# Patient Record
Sex: Male | Born: 1950 | Race: White | Hispanic: No | Marital: Married | Smoking: Never smoker
Health system: Southern US, Community
[De-identification: ages and names within clinical notes are randomized; demographics above are authoritative.]

## PROBLEM LIST (undated history)

## (undated) DIAGNOSIS — I10 Essential (primary) hypertension: Secondary | ICD-10-CM

## (undated) DIAGNOSIS — F419 Anxiety disorder, unspecified: Secondary | ICD-10-CM

## (undated) HISTORY — PX: NASAL SINUS SURGERY: SHX719

---

## 2002-07-01 ENCOUNTER — Ambulatory Visit (HOSPITAL_BASED_OUTPATIENT_CLINIC_OR_DEPARTMENT_OTHER): Admission: RE | Admit: 2002-07-01 | Discharge: 2002-07-01 | Payer: Self-pay | Admitting: Family Medicine

## 2010-02-22 ENCOUNTER — Emergency Department (HOSPITAL_COMMUNITY): Admission: EM | Admit: 2010-02-22 | Discharge: 2010-02-23 | Payer: Self-pay | Admitting: Emergency Medicine

## 2014-01-20 ENCOUNTER — Encounter (HOSPITAL_COMMUNITY): Payer: Self-pay | Admitting: Emergency Medicine

## 2014-01-20 ENCOUNTER — Emergency Department (HOSPITAL_COMMUNITY): Payer: BC Managed Care – PPO

## 2014-01-20 ENCOUNTER — Inpatient Hospital Stay (HOSPITAL_COMMUNITY)
Admission: EM | Admit: 2014-01-20 | Discharge: 2014-01-23 | DRG: 419 | Disposition: A | Discharge status: Home / Self Care | Payer: BC Managed Care – PPO | Attending: Surgery | Admitting: Surgery

## 2014-01-20 DIAGNOSIS — F411 Generalized anxiety disorder: Secondary | ICD-10-CM | POA: Diagnosis present

## 2014-01-20 DIAGNOSIS — R7989 Other specified abnormal findings of blood chemistry: Secondary | ICD-10-CM | POA: Diagnosis present

## 2014-01-20 DIAGNOSIS — R972 Elevated prostate specific antigen [PSA]: Secondary | ICD-10-CM | POA: Diagnosis present

## 2014-01-20 DIAGNOSIS — R112 Nausea with vomiting, unspecified: Secondary | ICD-10-CM

## 2014-01-20 DIAGNOSIS — Z801 Family history of malignant neoplasm of trachea, bronchus and lung: Secondary | ICD-10-CM

## 2014-01-20 DIAGNOSIS — I10 Essential (primary) hypertension: Secondary | ICD-10-CM | POA: Diagnosis present

## 2014-01-20 DIAGNOSIS — K812 Acute cholecystitis with chronic cholecystitis: Secondary | ICD-10-CM

## 2014-01-20 DIAGNOSIS — K810 Acute cholecystitis: Secondary | ICD-10-CM

## 2014-01-20 DIAGNOSIS — K8066 Calculus of gallbladder and bile duct with acute and chronic cholecystitis without obstruction: Principal | ICD-10-CM | POA: Diagnosis present

## 2014-01-20 HISTORY — DX: Anxiety disorder, unspecified: F41.9

## 2014-01-20 HISTORY — DX: Essential (primary) hypertension: I10

## 2014-01-20 LAB — CBC WITH DIFFERENTIAL/PLATELET
BASOS ABS: 0 10*3/uL (ref 0.0–0.1)
Basophils Relative: 0 % (ref 0–1)
EOS ABS: 0.2 10*3/uL (ref 0.0–0.7)
EOS PCT: 2 % (ref 0–5)
HEMATOCRIT: 37.1 % — AB (ref 39.0–52.0)
Hemoglobin: 13 g/dL (ref 13.0–17.0)
LYMPHS ABS: 1 10*3/uL (ref 0.7–4.0)
LYMPHS PCT: 11 % — AB (ref 12–46)
MCH: 29.8 pg (ref 26.0–34.0)
MCHC: 35 g/dL (ref 30.0–36.0)
MCV: 85.1 fL (ref 78.0–100.0)
MONO ABS: 1 10*3/uL (ref 0.1–1.0)
Monocytes Relative: 11 % (ref 3–12)
Neutro Abs: 6.9 10*3/uL (ref 1.7–7.7)
Neutrophils Relative %: 76 % (ref 43–77)
Platelets: 274 10*3/uL (ref 150–400)
RBC: 4.36 MIL/uL (ref 4.22–5.81)
RDW: 13.2 % (ref 11.5–15.5)
WBC: 9.1 10*3/uL (ref 4.0–10.5)

## 2014-01-20 LAB — COMPREHENSIVE METABOLIC PANEL
ALT: 119 U/L — ABNORMAL HIGH (ref 0–53)
AST: 115 U/L — ABNORMAL HIGH (ref 0–37)
Albumin: 3.3 g/dL — ABNORMAL LOW (ref 3.5–5.2)
Alkaline Phosphatase: 222 U/L — ABNORMAL HIGH (ref 39–117)
BILIRUBIN TOTAL: 1.8 mg/dL — AB (ref 0.3–1.2)
BUN: 21 mg/dL (ref 6–23)
CALCIUM: 10.4 mg/dL (ref 8.4–10.5)
CO2: 22 mEq/L (ref 19–32)
Chloride: 99 mEq/L (ref 96–112)
Creatinine, Ser: 1.26 mg/dL (ref 0.50–1.35)
GFR calc non Af Amer: 59 mL/min — ABNORMAL LOW (ref >90)
GFR, EST AFRICAN AMERICAN: 68 mL/min — AB (ref >90)
Glucose, Bld: 163 mg/dL — ABNORMAL HIGH (ref 70–99)
Potassium: 3.3 mEq/L — ABNORMAL LOW (ref 3.7–5.3)
SODIUM: 139 meq/L (ref 137–147)
TOTAL PROTEIN: 6.8 g/dL (ref 6.0–8.3)

## 2014-01-20 LAB — URINALYSIS, ROUTINE W REFLEX MICROSCOPIC
BILIRUBIN URINE: NEGATIVE
GLUCOSE, UA: NEGATIVE mg/dL
HGB URINE DIPSTICK: NEGATIVE
Ketones, ur: NEGATIVE mg/dL
Leukocytes, UA: NEGATIVE
Nitrite: NEGATIVE
PH: 8 (ref 5.0–8.0)
Protein, ur: NEGATIVE mg/dL
Specific Gravity, Urine: 1.016 (ref 1.005–1.030)
Urobilinogen, UA: 1 mg/dL (ref 0.0–1.0)

## 2014-01-20 LAB — TROPONIN I

## 2014-01-20 LAB — LIPASE, BLOOD: LIPASE: 29 U/L (ref 11–59)

## 2014-01-20 MED ORDER — MORPHINE SULFATE 4 MG/ML IJ SOLN
4.0000 mg | Freq: Once | INTRAMUSCULAR | Status: AC
  Administered 2014-01-20: 4 mg via INTRAVENOUS
  Filled 2014-01-20: qty 1

## 2014-01-20 MED ORDER — SODIUM CHLORIDE 0.9 % IV BOLUS (SEPSIS)
1000.0000 mL | Freq: Once | INTRAVENOUS | Status: AC
  Administered 2014-01-20: 1000 mL via INTRAVENOUS

## 2014-01-20 MED ORDER — ONDANSETRON HCL 4 MG/2ML IJ SOLN
4.0000 mg | Freq: Once | INTRAMUSCULAR | Status: AC
  Administered 2014-01-20: 4 mg via INTRAVENOUS
  Filled 2014-01-20: qty 2

## 2014-01-20 NOTE — ED Notes (Signed)
Bed: GM01 Expected date: 01/20/14 Expected time: 10:09 PM Means of arrival: Ambulance Comments: Abdominal pain

## 2014-01-20 NOTE — ED Notes (Addendum)
VS noted Patient states that heart rate is WNL for him  Patient states "I'm athletic" Patient appears in NAD

## 2014-01-20 NOTE — ED Notes (Signed)
US at bedside

## 2014-01-20 NOTE — ED Provider Notes (Signed)
CSN: [id number]     Arrival date & time 01/20/14  2225 History   First MD Initiated Contact with Patient 01/20/14 2241     Chief Complaint  Patient presents with  . Abdominal Pain   (Consider location/radiation/quality/duration/timing/severity/associated sxs/prior Treatment) Patient is a 63 y.o. male presenting with abdominal pain. The history is provided by the patient and medical records.  Abdominal Pain Associated symptoms: nausea and vomiting    This is a 63 year old male with past medical history significant for hypertension and depression, presenting to the ED for abdominal pain, nausea, and vomiting. Patient states that for the past 10 days he has had intermittent episodes of sharp, stabbing pain to his right upper quadrant with associated non-bloody, non-bilious emesis.  He states sx do not appear to be related to food intake. Bowel movements normal, no diarrhea. This is freely passing flatus.  No prior abdominal surgeries.  Last PO intake around noon today.  No fevers or chills. Pt is currently on cipro for elevated PSA-- states he does have some increased urinary frequency and dysuria recently.  History reviewed. No pertinent past medical history. History reviewed. No pertinent past surgical history. History reviewed. No pertinent family history. History  Substance Use Topics  . Smoking status: Not on file  . Smokeless tobacco: Not on file  . Alcohol Use: Not on file    Review of Systems  Gastrointestinal: Positive for nausea, vomiting and abdominal pain.  All other systems reviewed and are negative.   Allergies  Other and Shellfish allergy  Home Medications   Current Outpatient Rx  Name  Route  Sig  Dispense  Refill  . ciprofloxacin (CIPRO) 500 MG tablet   Oral   Take 500 mg by mouth 2 (two) times daily.         . DULoxetine (CYMBALTA) 30 MG capsule   Oral   Take 30 mg by mouth daily.         . famotidine (PEPCID AC) 10 MG chewable tablet   Oral   Chew 10  mg by mouth once.         . valsartan-hydrochlorothiazide (DIOVAN-HCT) 320-12.5 MG per tablet   Oral   Take 1 tablet by mouth every morning.          BP 92/52  Pulse 42  Temp(Src) 97.8 F (36.6 C)  SpO2 100%  Physical Exam  Nursing note and vitals reviewed. Constitutional: He is oriented to person, place, and time. He appears well-developed and well-nourished. No distress.  HENT:  Head: Normocephalic and atraumatic.  Mouth/Throat: Oropharynx is clear and moist. Mucous membranes are dry.  Mucous membranes dry  Eyes: Conjunctivae and EOM are normal. Pupils are equal, round, and reactive to light.  Neck: Normal range of motion. Neck supple.  Cardiovascular: Normal rate, regular rhythm and normal heart sounds.   Pulmonary/Chest: Effort normal and breath sounds normal. No respiratory distress. He has no wheezes.  Abdominal: Soft. Bowel sounds are normal. There is tenderness in the right upper quadrant and epigastric area. There is positive Murphy's sign. There is no rigidity, no guarding and no CVA tenderness.  Abdomen soft, non-distended, TTP RUQ and epigastric regions with positive Murphy's sign  Musculoskeletal: Normal range of motion. He exhibits no edema.  Neurological: He is alert and oriented to person, place, and time.  Skin: Skin is warm and dry. He is not diaphoretic.  Psychiatric: He has a normal mood and affect.    ED Course  Procedures (including critical care time)  Labs Review Labs Reviewed  CBC WITH DIFFERENTIAL - Abnormal; Notable for the following:    HCT 37.1 (*)    Lymphocytes Relative 11 (*)    All other components within normal limits  COMPREHENSIVE METABOLIC PANEL - Abnormal; Notable for the following:    Potassium 3.3 (*)    Glucose, Bld 163 (*)    Albumin 3.3 (*)    AST 115 (*)    ALT 119 (*)    Alkaline Phosphatase 222 (*)    Total Bilirubin 1.8 (*)    GFR calc non Af Amer 59 (*)    GFR calc Af Amer 68 (*)    All other components within normal  limits  URINALYSIS, ROUTINE W REFLEX MICROSCOPIC - Abnormal; Notable for the following:    APPearance CLOUDY (*)    All other components within normal limits  LIPASE, BLOOD  TROPONIN I   Imaging Review US Abdomen Complete  01/21/2014   CLINICAL DATA:  Right upper quadrant pain, vomiting.  Elevated LFTs.  EXAM: ULTRASOUND ABDOMEN COMPLETE  COMPARISON:  CT UROGRAM dated 01/01/2013  FINDINGS: Gallbladder:  Multiple gallstones and sludge noted within the gallbladder. Gallbladder wall thickening, measuring up to 7 mm. The patient was significantly tender over the gallbladder during the study. Small amount of pericholecystic fluid. There are echogenic foci within the thickened anterior gallbladder wall. While this could reflect adherent cholesterol crystals, I cannot completely exclude gas within the gallbladder wall anteriorly.  Common bile duct:  Diameter: Borderline in caliber at 7 mm.  No visible ductal stones.  Liver:  Hyperechoic area in the right hepatic lobe of measuring up to 8 mm compatible with small hemangioma.  IVC:  No abnormality visualized.  Pancreas:  Not visualized due to overlying bowel gas.  Spleen:  Size and appearance within normal limits.  Right Kidney:  Length: 10.3 cm. Echogenicity within normal limits. No mass or hydronephrosis visualized.  Left Kidney:  Length: 11.6 cm. Echogenicity within normal limits. No mass or hydronephrosis visualized.  Abdominal aorta:  No aneurysm visualized.  Other findings:  None.  IMPRESSION: Numerous small gallstones within the gallbladder. Associated gallbladder wall thickening, pericholecystic fluid and positive sonographic Murphy sign compatible with acute cholecystitis. Echogenic foci within the anterior thickened gallbladder wall. Cannot completely exclude gallbladder wall gas.  These results were called by telephone at the time of interpretation on 01/21/2014 at 1:01 AM to [facility] , who verbally acknowledged these results.   Electronically Signed    By: Rolm Baptise M.D.   On: 01/21/2014 01:05    EKG Interpretation   None       MDM   Final diagnoses:  Acute cholecystitis  Nausea & vomiting   Pt with intermittent abdominal pain w/ nausea and vomiting over the past 10 days.  No fevers or chills.  On exam RUQ TTP with + Murphy's sign.  Will obtain labs and u/s.  Labs revealing elevated liver enzymes, alk phos, and bili.  U/s with acute cholecystitis, question of gallbladder wall gas vs cholesterol deposits.  Pt remains afebrile, BP's soft but pt is asx.  Pain and nausea well controlled.  Discussed case with general surgery, advised to give IV abx and they will evaluate in the am.  6:06 AM Still awaiting general surgery evaluation.  Pt remains stable, pain well controlled.  Larene Pickett, PA-C 01/21/14 [phone]

## 2014-01-20 NOTE — ED Notes (Signed)
Patient arrives via EMS for c/o abdominal pain x 10 days +N/V, no diarrhea per patient Patient given 4 mg Zofran IV by EMS en route to ED 20 Right AC placed by EMS

## 2014-01-21 ENCOUNTER — Observation Stay (HOSPITAL_COMMUNITY): Payer: BC Managed Care – PPO | Admitting: Certified Registered Nurse Anesthetist

## 2014-01-21 ENCOUNTER — Observation Stay (HOSPITAL_COMMUNITY): Payer: BC Managed Care – PPO

## 2014-01-21 ENCOUNTER — Encounter (HOSPITAL_COMMUNITY): Payer: BC Managed Care – PPO | Admitting: Certified Registered Nurse Anesthetist

## 2014-01-21 ENCOUNTER — Encounter (HOSPITAL_COMMUNITY): Payer: Self-pay | Admitting: Certified Registered Nurse Anesthetist

## 2014-01-21 ENCOUNTER — Encounter (HOSPITAL_COMMUNITY): Admission: EM | Disposition: A | Discharge status: Home / Self Care | Payer: Self-pay | Source: Home / Self Care

## 2014-01-21 DIAGNOSIS — R1011 Right upper quadrant pain: Secondary | ICD-10-CM

## 2014-01-21 DIAGNOSIS — K8020 Calculus of gallbladder without cholecystitis without obstruction: Secondary | ICD-10-CM

## 2014-01-21 DIAGNOSIS — K812 Acute cholecystitis with chronic cholecystitis: Secondary | ICD-10-CM

## 2014-01-21 HISTORY — PX: CHOLECYSTECTOMY: SHX55

## 2014-01-21 LAB — CREATININE, SERUM
CREATININE: 1.17 mg/dL (ref 0.50–1.35)
GFR calc Af Amer: 75 mL/min — ABNORMAL LOW (ref >90)
GFR, EST NON AFRICAN AMERICAN: 65 mL/min — AB (ref >90)

## 2014-01-21 LAB — CBC
HCT: 35.2 % — ABNORMAL LOW (ref 39.0–52.0)
HEMOGLOBIN: 12.3 g/dL — AB (ref 13.0–17.0)
MCH: 29.9 pg (ref 26.0–34.0)
MCHC: 34.9 g/dL (ref 30.0–36.0)
MCV: 85.6 fL (ref 78.0–100.0)
PLATELETS: 271 10*3/uL (ref 150–400)
RBC: 4.11 MIL/uL — ABNORMAL LOW (ref 4.22–5.81)
RDW: 13.5 % (ref 11.5–15.5)
WBC: 7.8 10*3/uL (ref 4.0–10.5)

## 2014-01-21 SURGERY — LAPAROSCOPIC CHOLECYSTECTOMY WITH INTRAOPERATIVE CHOLANGIOGRAM
Anesthesia: General

## 2014-01-21 MED ORDER — PIPERACILLIN-TAZOBACTAM 3.375 G IVPB
3.3750 g | Freq: Once | INTRAVENOUS | Status: AC
  Administered 2014-01-21: 3.375 g via INTRAVENOUS
  Filled 2014-01-21 (×2): qty 50

## 2014-01-21 MED ORDER — ONDANSETRON HCL 4 MG/2ML IJ SOLN: 4.0000 mg | Freq: Four times a day (QID) | INTRAMUSCULAR | Status: DC | PRN

## 2014-01-21 MED ORDER — HEPARIN SODIUM (PORCINE) 5000 UNIT/ML IJ SOLN: 5000.0000 [IU] | Freq: Three times a day (TID) | INTRAMUSCULAR | Status: DC

## 2014-01-21 MED ORDER — DULOXETINE HCL 30 MG PO CPEP
30.0000 mg | ORAL_CAPSULE | Freq: Every day | ORAL | Status: DC
  Administered 2014-01-21 – 2014-01-23 (×3): 30 mg via ORAL
  Filled 2014-01-21 (×3): qty 1

## 2014-01-21 MED ORDER — PROPOFOL 10 MG/ML IV BOLUS
INTRAVENOUS | Status: DC | PRN
  Administered 2014-01-21: 150 mg via INTRAVENOUS

## 2014-01-21 MED ORDER — DEXAMETHASONE SODIUM PHOSPHATE 10 MG/ML IJ SOLN
INTRAMUSCULAR | Status: AC
  Filled 2014-01-21: qty 1

## 2014-01-21 MED ORDER — CEFAZOLIN SODIUM-DEXTROSE 2-3 GM-% IV SOLR
INTRAVENOUS | Status: AC
  Filled 2014-01-21: qty 50

## 2014-01-21 MED ORDER — FENTANYL CITRATE 0.05 MG/ML IJ SOLN
25.0000 ug | INTRAMUSCULAR | Status: DC | PRN
  Administered 2014-01-21 (×3): 50 ug via INTRAVENOUS

## 2014-01-21 MED ORDER — BUPIVACAINE HCL (PF) 0.25 % IJ SOLN
INTRAMUSCULAR | Status: AC
  Filled 2014-01-21: qty 30

## 2014-01-21 MED ORDER — SODIUM CHLORIDE 0.9 % IJ SOLN
INTRAMUSCULAR | Status: AC
  Filled 2014-01-21: qty 10

## 2014-01-21 MED ORDER — ONDANSETRON HCL 4 MG PO TABS: 4.0000 mg | ORAL_TABLET | Freq: Four times a day (QID) | ORAL | Status: DC | PRN

## 2014-01-21 MED ORDER — GLYCOPYRROLATE 0.2 MG/ML IJ SOLN
INTRAMUSCULAR | Status: DC | PRN
  Administered 2014-01-21: 0.2 mg via INTRAVENOUS
  Administered 2014-01-21: 0.6 mg via INTRAVENOUS

## 2014-01-21 MED ORDER — MIDAZOLAM HCL 2 MG/2ML IJ SOLN
INTRAMUSCULAR | Status: AC
  Filled 2014-01-21: qty 2

## 2014-01-21 MED ORDER — SODIUM CHLORIDE 0.9 % IV SOLN
INTRAVENOUS | Status: DC
  Administered 2014-01-21: 01:00:00 via INTRAVENOUS

## 2014-01-21 MED ORDER — HYDROCODONE-ACETAMINOPHEN 5-325 MG PO TABS
1.0000 | ORAL_TABLET | ORAL | Status: DC | PRN
  Administered 2014-01-21 – 2014-01-22 (×3): 2 via ORAL
  Filled 2014-01-21 (×3): qty 2

## 2014-01-21 MED ORDER — LACTATED RINGERS IV SOLN: INTRAVENOUS | Status: DC

## 2014-01-21 MED ORDER — HEPARIN SODIUM (PORCINE) 5000 UNIT/ML IJ SOLN
5000.0000 [IU] | Freq: Three times a day (TID) | INTRAMUSCULAR | Status: DC
  Administered 2014-01-21 – 2014-01-23 (×4): 5000 [IU] via SUBCUTANEOUS
  Filled 2014-01-21 (×8): qty 1

## 2014-01-21 MED ORDER — FENTANYL CITRATE 0.05 MG/ML IJ SOLN
INTRAMUSCULAR | Status: AC
  Filled 2014-01-21: qty 5

## 2014-01-21 MED ORDER — LIDOCAINE HCL (CARDIAC) 20 MG/ML IV SOLN
INTRAVENOUS | Status: AC
  Filled 2014-01-21: qty 5

## 2014-01-21 MED ORDER — GLYCOPYRROLATE 0.2 MG/ML IJ SOLN
INTRAMUSCULAR | Status: AC
  Filled 2014-01-21: qty 1

## 2014-01-21 MED ORDER — MORPHINE SULFATE 2 MG/ML IJ SOLN
1.0000 mg | INTRAMUSCULAR | Status: DC | PRN
  Administered 2014-01-21 – 2014-01-22 (×3): 2 mg via INTRAVENOUS
  Filled 2014-01-21 (×3): qty 1

## 2014-01-21 MED ORDER — SUCCINYLCHOLINE CHLORIDE 20 MG/ML IJ SOLN
INTRAMUSCULAR | Status: AC
  Filled 2014-01-21: qty 1

## 2014-01-21 MED ORDER — MORPHINE SULFATE 4 MG/ML IJ SOLN
4.0000 mg | INTRAMUSCULAR | Status: DC | PRN
  Administered 2014-01-21: 4 mg via INTRAVENOUS
  Filled 2014-01-21: qty 1

## 2014-01-21 MED ORDER — KCL IN DEXTROSE-NACL 20-5-0.45 MEQ/L-%-% IV SOLN: INTRAVENOUS | Status: DC

## 2014-01-21 MED ORDER — ROCURONIUM BROMIDE 100 MG/10ML IV SOLN
INTRAVENOUS | Status: AC
  Filled 2014-01-21: qty 1

## 2014-01-21 MED ORDER — CEFAZOLIN SODIUM-DEXTROSE 2-3 GM-% IV SOLR
INTRAVENOUS | Status: DC | PRN
  Administered 2014-01-21: 2 g via INTRAVENOUS

## 2014-01-21 MED ORDER — ONDANSETRON HCL 4 MG/2ML IJ SOLN
INTRAMUSCULAR | Status: DC | PRN
  Administered 2014-01-21: 4 mg via INTRAVENOUS

## 2014-01-21 MED ORDER — SUCCINYLCHOLINE CHLORIDE 20 MG/ML IJ SOLN
INTRAMUSCULAR | Status: DC | PRN
  Administered 2014-01-21: 100 mg via INTRAVENOUS

## 2014-01-21 MED ORDER — NEOSTIGMINE METHYLSULFATE 1 MG/ML IJ SOLN
INTRAMUSCULAR | Status: DC | PRN
  Administered 2014-01-21: 5 mg via INTRAVENOUS

## 2014-01-21 MED ORDER — KCL IN DEXTROSE-NACL 20-5-0.45 MEQ/L-%-% IV SOLN
INTRAVENOUS | Status: DC
  Administered 2014-01-21 – 2014-01-23 (×3): via INTRAVENOUS
  Filled 2014-01-21 (×6): qty 1000

## 2014-01-21 MED ORDER — DIATRIZOATE MEGLUMINE 30 % UR SOLN
URETHRAL | Status: DC | PRN
  Administered 2014-01-21: 5 mL

## 2014-01-21 MED ORDER — EPHEDRINE SULFATE 50 MG/ML IJ SOLN
INTRAMUSCULAR | Status: AC
  Filled 2014-01-21: qty 1

## 2014-01-21 MED ORDER — IBUPROFEN 600 MG PO TABS
600.0000 mg | ORAL_TABLET | Freq: Four times a day (QID) | ORAL | Status: DC | PRN
  Filled 2014-01-21: qty 1

## 2014-01-21 MED ORDER — EPHEDRINE SULFATE 50 MG/ML IJ SOLN
INTRAMUSCULAR | Status: DC | PRN
  Administered 2014-01-21: 10 mg via INTRAVENOUS
  Administered 2014-01-21: 5 mg via INTRAVENOUS
  Administered 2014-01-21: 10 mg via INTRAVENOUS

## 2014-01-21 MED ORDER — FENTANYL CITRATE 0.05 MG/ML IJ SOLN
INTRAMUSCULAR | Status: AC
  Filled 2014-01-21: qty 2

## 2014-01-21 MED ORDER — PHENOL 1.4 % MT LIQD
1.0000 | OROMUCOSAL | Status: DC | PRN
  Administered 2014-01-21: 1 via OROMUCOSAL
  Filled 2014-01-21: qty 177

## 2014-01-21 MED ORDER — MORPHINE SULFATE 4 MG/ML IJ SOLN
4.0000 mg | Freq: Once | INTRAMUSCULAR | Status: AC
  Administered 2014-01-21: 4 mg via INTRAVENOUS
  Filled 2014-01-21: qty 1

## 2014-01-21 MED ORDER — DIPHENHYDRAMINE HCL 50 MG/ML IJ SOLN: 12.5000 mg | Freq: Four times a day (QID) | INTRAMUSCULAR | Status: DC | PRN

## 2014-01-21 MED ORDER — DIPHENHYDRAMINE HCL 12.5 MG/5ML PO ELIX: 12.5000 mg | ORAL_SOLUTION | Freq: Four times a day (QID) | ORAL | Status: DC | PRN

## 2014-01-21 MED ORDER — PROMETHAZINE HCL 25 MG/ML IJ SOLN: 6.2500 mg | INTRAMUSCULAR | Status: DC | PRN

## 2014-01-21 MED ORDER — GLYCOPYRROLATE 0.2 MG/ML IJ SOLN
INTRAMUSCULAR | Status: AC
  Filled 2014-01-21: qty 3

## 2014-01-21 MED ORDER — LACTATED RINGERS IR SOLN
Status: DC | PRN
  Administered 2014-01-21: 2000 mL

## 2014-01-21 MED ORDER — ROCURONIUM BROMIDE 100 MG/10ML IV SOLN
INTRAVENOUS | Status: DC | PRN
  Administered 2014-01-21: 35 mg via INTRAVENOUS
  Administered 2014-01-21 (×2): 5 mg via INTRAVENOUS

## 2014-01-21 MED ORDER — LACTATED RINGERS IV SOLN
INTRAVENOUS | Status: DC | PRN
  Administered 2014-01-21 (×2): via INTRAVENOUS

## 2014-01-21 MED ORDER — BUPIVACAINE HCL (PF) 0.25 % IJ SOLN
INTRAMUSCULAR | Status: DC | PRN
  Administered 2014-01-21: 30 mL

## 2014-01-21 MED ORDER — ONDANSETRON HCL 4 MG/2ML IJ SOLN
INTRAMUSCULAR | Status: AC
  Filled 2014-01-21: qty 2

## 2014-01-21 MED ORDER — PHENYLEPHRINE HCL 10 MG/ML IJ SOLN
INTRAMUSCULAR | Status: DC | PRN
  Administered 2014-01-21 (×2): 80 ug via INTRAVENOUS

## 2014-01-21 MED ORDER — NEOSTIGMINE METHYLSULFATE 1 MG/ML IJ SOLN
INTRAMUSCULAR | Status: AC
  Filled 2014-01-21: qty 10

## 2014-01-21 MED ORDER — ALPRAZOLAM 0.25 MG PO TABS
0.2500 mg | ORAL_TABLET | Freq: Two times a day (BID) | ORAL | Status: DC | PRN
  Administered 2014-01-21 (×2): 0.25 mg via ORAL
  Filled 2014-01-21 (×2): qty 1

## 2014-01-21 MED ORDER — PROPOFOL 10 MG/ML IV BOLUS
INTRAVENOUS | Status: AC
  Filled 2014-01-21: qty 20

## 2014-01-21 MED ORDER — FENTANYL CITRATE 0.05 MG/ML IJ SOLN
INTRAMUSCULAR | Status: DC | PRN
  Administered 2014-01-21 (×5): 50 ug via INTRAVENOUS

## 2014-01-21 MED ORDER — HEMOSTATIC AGENTS (NO CHARGE) OPTIME
TOPICAL | Status: DC | PRN
  Administered 2014-01-21: 1 via TOPICAL

## 2014-01-21 MED ORDER — PANTOPRAZOLE SODIUM 40 MG IV SOLR
40.0000 mg | Freq: Every day | INTRAVENOUS | Status: DC
  Administered 2014-01-21 – 2014-01-22 (×2): 40 mg via INTRAVENOUS
  Filled 2014-01-21 (×3): qty 40

## 2014-01-21 MED ORDER — MEPERIDINE HCL 50 MG/ML IJ SOLN: 6.2500 mg | INTRAMUSCULAR | Status: DC | PRN

## 2014-01-21 MED ORDER — MIDAZOLAM HCL 5 MG/5ML IJ SOLN
INTRAMUSCULAR | Status: DC | PRN
Start: 2014-01-21 — End: 2014-01-21
  Administered 2014-01-21: 2 mg via INTRAVENOUS

## 2014-01-21 MED ORDER — CEFAZOLIN SODIUM 1-5 GM-% IV SOLN
1.0000 g | Freq: Three times a day (TID) | INTRAVENOUS | Status: DC
  Administered 2014-01-21 – 2014-01-23 (×6): 1 g via INTRAVENOUS
  Filled 2014-01-21 (×7): qty 50

## 2014-01-21 MED ORDER — LIDOCAINE HCL (CARDIAC) 20 MG/ML IV SOLN
INTRAVENOUS | Status: DC | PRN
  Administered 2014-01-21: 100 mg via INTRAVENOUS

## 2014-01-21 MED ORDER — DEXAMETHASONE SODIUM PHOSPHATE 10 MG/ML IJ SOLN
INTRAMUSCULAR | Status: DC | PRN
  Administered 2014-01-21: 10 mg via INTRAVENOUS

## 2014-01-21 SURGICAL SUPPLY — 42 items
APPLIER CLIP ROT 10 11.4 M/L (STAPLE) ×3
BENZOIN TINCTURE PRP APPL 2/3 (GAUZE/BANDAGES/DRESSINGS) ×3 IMPLANT
CANISTER SUCTION 2500CC (MISCELLANEOUS) ×3 IMPLANT
CHLORAPREP W/TINT 26ML (MISCELLANEOUS) ×3 IMPLANT
CHOLANGIOGRAM CATH TAUT (CATHETERS) ×3 IMPLANT
CLIP APPLIE ROT 10 11.4 M/L (STAPLE) ×1 IMPLANT
CLOSURE WOUND 1/4X4 (GAUZE/BANDAGES/DRESSINGS) ×1
COVER MAYO STAND STRL (DRAPES) ×3 IMPLANT
DECANTER SPIKE VIAL GLASS SM (MISCELLANEOUS) IMPLANT
DERMABOND ADVANCED (GAUZE/BANDAGES/DRESSINGS) ×4
DERMABOND ADVANCED .7 DNX12 (GAUZE/BANDAGES/DRESSINGS) ×2 IMPLANT
DRAPE C-ARM 42X120 X-RAY (DRAPES) ×3 IMPLANT
DRAPE LAPAROSCOPIC ABDOMINAL (DRAPES) ×3 IMPLANT
DRAPE UTILITY XL STRL (DRAPES) ×3 IMPLANT
ELECT REM PT RETURN 9FT ADLT (ELECTROSURGICAL) ×3
ELECTRODE REM PT RTRN 9FT ADLT (ELECTROSURGICAL) ×1 IMPLANT
ENDOLOOP SUT PDS II  0 18 (SUTURE) ×6
ENDOLOOP SUT PDS II 0 18 (SUTURE) ×3 IMPLANT
FILTER SMOKE EVAC LAPAROSHD (FILTER) ×3 IMPLANT
GLOVE SURG SIGNA 7.5 PF LTX (GLOVE) ×3 IMPLANT
GOWN STRL REUS W/TWL XL LVL3 (GOWN DISPOSABLE) ×9 IMPLANT
HEMOSTAT SURGICEL 4X8 (HEMOSTASIS) ×3 IMPLANT
IV CATH 14GX2 1/4 (CATHETERS) ×3 IMPLANT
IV LACTATED RINGER IRRG 3000ML (IV SOLUTION) ×2
IV LR IRRIG 3000ML ARTHROMATIC (IV SOLUTION) ×1 IMPLANT
IV SET MACRO CATH EXT 6 LUER (IV SETS) ×3 IMPLANT
KIT BASIN OR (CUSTOM PROCEDURE TRAY) ×3 IMPLANT
NS IRRIG 1000ML POUR BTL (IV SOLUTION) IMPLANT
POUCH SPECIMEN RETRIEVAL 10MM (ENDOMECHANICALS) ×3 IMPLANT
SET IRRIG TUBING LAPAROSCOPIC (IRRIGATION / IRRIGATOR) ×3 IMPLANT
SLEEVE XCEL OPT CAN 5 100 (ENDOMECHANICALS) ×3 IMPLANT
SOLUTION ANTI FOG 6CC (MISCELLANEOUS) ×3 IMPLANT
STOPCOCK 4 WAY LG BORE MALE ST (IV SETS) ×3 IMPLANT
STRIP CLOSURE SKIN 1/4X4 (GAUZE/BANDAGES/DRESSINGS) ×2 IMPLANT
SUT VIC AB 5-0 PS2 18 (SUTURE) ×3 IMPLANT
TOWEL OR 17X26 10 PK STRL BLUE (TOWEL DISPOSABLE) ×3 IMPLANT
TOWEL OR NON WOVEN STRL DISP B (DISPOSABLE) ×3 IMPLANT
TRAY LAP CHOLE (CUSTOM PROCEDURE TRAY) ×3 IMPLANT
TROCAR BLADELESS OPT 5 100 (ENDOMECHANICALS) ×3 IMPLANT
TROCAR XCEL BLUNT TIP 100MML (ENDOMECHANICALS) ×3 IMPLANT
TROCAR XCEL NON-BLD 11X100MML (ENDOMECHANICALS) ×3 IMPLANT
TUBING INSUFFLATION 10FT LAP (TUBING) ×3 IMPLANT

## 2014-01-21 NOTE — H&P (Signed)
Chief Complaint:  Right upper quadrant pain and cholecystitis by Christopher Jensen  History of Present Illness:  Christopher Jensen is an 63 y.o. male followed by Dr. Delfino Lovett and who had a Physical Exam  within the last 2 weeks (elevated PSA) presents with a recurrent right upper quadrant pain  and an ultrasound showing cholecystitis.  Informed consent was obtained in the ED regarding cholecystectomy with potential complications not limited to CBD injury, bowel injuries, or bile leaks.    History reviewed. No pertinent past medical history.  History reviewed. No pertinent past surgical history.  Current Facility-Administered Medications  Medication Dose Route Frequency Provider Last Rate Last Dose  . 0.9 %  sodium chloride infusion   Intravenous Continuous Larene Pickett, PA-C 200 mL/hr at 01/21/14 0122    . morphine 4 MG/ML injection 4 mg  4 mg Intravenous Q2H PRN Larene Pickett, PA-C   4 mg at 01/21/14 3491   Current Outpatient Prescriptions  Medication Sig Dispense Refill  . ciprofloxacin (CIPRO) 500 MG tablet Take 500 mg by mouth 2 (two) times daily.      . DULoxetine (CYMBALTA) 30 MG capsule Take 30 mg by mouth daily.      . famotidine (PEPCID AC) 10 MG chewable tablet Chew 10 mg by mouth once.      . valsartan-hydrochlorothiazide (DIOVAN-HCT) 320-12.5 MG per tablet Take 1 tablet by mouth every morning.       Other and Shellfish allergy History reviewed. No pertinent family history. Social History:   has no tobacco, alcohol, and drug history on file.   REVIEW OF SYSTEMS - PERTINENT POSITIVES ONLY: No history of DVT;  Has elevated PSA in Dr. Noland Fordyce office.    Physical Exam:   Blood pressure 129/79, pulse 76, temperature 97.8 F (36.6 C), resp. rate 18, SpO2 97.00%. There is no height or weight on file to calculate BMI.  Gen:  WDWN WM NAD  Neurological: Alert and oriented to person, place, and time. Motor and sensory function is grossly intact  Head: Normocephalic and atraumatic.   Eyes: Conjunctivae are normal. Pupils are equal, round, and reactive to light. No scleral icterus.  Neck: Normal range of motion. Neck supple. No tracheal deviation or thyromegaly present.  Cardiovascular:  SR without murmurs or gallops.  No carotid bruits Respiratory: Effort normal.  No respiratory distress. No chest wall tenderness. Breath sounds normal.  No wheezes, rales or rhonchi.  Abdomen:  Resolving soreness in the right upper quadrant GU: Musculoskeletal: Normal range of motion. Extremities are nontender. No cyanosis, edema or clubbing noted Lymphadenopathy: No cervical, preauricular, postauricular or axillary adenopathy is present Skin: Skin is warm and dry. No rash noted. No diaphoresis. No erythema. No pallor. Pscyh: Normal mood and affect. Behavior is normal. Judgment and thought content normal.   LABORATORY RESULTS: Results for orders placed during the hospital encounter of 01/20/14 (from the past 48 hour(s))  CBC WITH DIFFERENTIAL     Status: Abnormal   Collection Time    01/20/14 10:52 PM      Result Value Ref Range   WBC 9.1  4.0 - 10.5 K/uL   RBC 4.36  4.22 - 5.81 MIL/uL   Hemoglobin 13.0  13.0 - 17.0 g/dL   HCT 37.1 (*) 39.0 - 52.0 %   MCV 85.1  78.0 - 100.0 fL   MCH 29.8  26.0 - 34.0 pg   MCHC 35.0  30.0 - 36.0 g/dL   RDW 13.2  11.5 - 15.5 %  Platelets 274  150 - 400 K/uL   Neutrophils Relative % 76  43 - 77 %   Neutro Abs 6.9  1.7 - 7.7 K/uL   Lymphocytes Relative 11 (*) 12 - 46 %   Lymphs Abs 1.0  0.7 - 4.0 K/uL   Monocytes Relative 11  3 - 12 %   Monocytes Absolute 1.0  0.1 - 1.0 K/uL   Eosinophils Relative 2  0 - 5 %   Eosinophils Absolute 0.2  0.0 - 0.7 K/uL   Basophils Relative 0  0 - 1 %   Basophils Absolute 0.0  0.0 - 0.1 K/uL  COMPREHENSIVE METABOLIC PANEL     Status: Abnormal   Collection Time    01/20/14 10:52 PM      Result Value Ref Range   Sodium 139  137 - 147 mEq/L   Potassium 3.3 (*) 3.7 - 5.3 mEq/L   Chloride 99  96 - 112 mEq/L   CO2  22  19 - 32 mEq/L   Glucose, Bld 163 (*) 70 - 99 mg/dL   BUN 21  6 - 23 mg/dL   Creatinine, Ser 1.26  0.50 - 1.35 mg/dL   Calcium 10.4  8.4 - 10.5 mg/dL   Total Protein 6.8  6.0 - 8.3 g/dL   Albumin 3.3 (*) 3.5 - 5.2 g/dL   AST 115 (*) 0 - 37 U/L   ALT 119 (*) 0 - 53 U/L   Alkaline Phosphatase 222 (*) 39 - 117 U/L   Total Bilirubin 1.8 (*) 0.3 - 1.2 mg/dL   GFR calc non Af Amer 59 (*) >90 mL/min   GFR calc Af Amer 68 (*) >90 mL/min   Comment: (NOTE)     The eGFR has been calculated using the CKD EPI equation.     This calculation has not been validated in all clinical situations.     eGFR's persistently <90 mL/min signify possible Chronic Kidney     Disease.  LIPASE, BLOOD     Status: None   Collection Time    01/20/14 10:52 PM      Result Value Ref Range   Lipase 29  11 - 59 U/L  TROPONIN I     Status: None   Collection Time    01/20/14 10:52 PM      Result Value Ref Range   Troponin I <0.30  <0.30 ng/mL   Comment:            Due to the release kinetics of cTnI,     a negative result within the first hours     of the onset of symptoms does not rule out     myocardial infarction with certainty.     If myocardial infarction is still suspected,     repeat the test at appropriate intervals.  URINALYSIS, ROUTINE W REFLEX MICROSCOPIC     Status: Abnormal   Collection Time    01/20/14 11:16 PM      Result Value Ref Range   Color, Urine YELLOW  YELLOW   APPearance CLOUDY (*) CLEAR   Specific Gravity, Urine 1.016  1.005 - 1.030   pH 8.0  5.0 - 8.0   Glucose, UA NEGATIVE  NEGATIVE mg/dL   Hgb urine dipstick NEGATIVE  NEGATIVE   Bilirubin Urine NEGATIVE  NEGATIVE   Ketones, ur NEGATIVE  NEGATIVE mg/dL   Protein, ur NEGATIVE  NEGATIVE mg/dL   Urobilinogen, UA 1.0  0.0 - 1.0 mg/dL   Nitrite NEGATIVE  NEGATIVE   Leukocytes, UA NEGATIVE  NEGATIVE   Comment: MICROSCOPIC NOT DONE ON URINES WITH NEGATIVE PROTEIN, BLOOD, LEUKOCYTES, NITRITE, OR GLUCOSE <1000 mg/dL.    RADIOLOGY  RESULTS: US Abdomen Complete  01/21/2014   CLINICAL DATA:  Right upper quadrant pain, vomiting.  Elevated LFTs.  EXAM: ULTRASOUND ABDOMEN COMPLETE  COMPARISON:  CT UROGRAM dated 01/01/2013  FINDINGS: Gallbladder:  Multiple gallstones and sludge noted within the gallbladder. Gallbladder wall thickening, measuring up to 7 mm. The patient was significantly tender over the gallbladder during the study. Small amount of pericholecystic fluid. There are echogenic foci within the thickened anterior gallbladder wall. While this could reflect adherent cholesterol crystals, I cannot completely exclude gas within the gallbladder wall anteriorly.  Common bile duct:  Diameter: Borderline in caliber at 7 mm.  No visible ductal stones.  Liver:  Hyperechoic area in the right hepatic lobe of measuring up to 8 mm compatible with small hemangioma.  IVC:  No abnormality visualized.  Pancreas:  Not visualized due to overlying bowel gas.  Spleen:  Size and appearance within normal limits.  Right Kidney:  Length: 10.3 cm. Echogenicity within normal limits. No mass or hydronephrosis visualized.  Left Kidney:  Length: 11.6 cm. Echogenicity within normal limits. No mass or hydronephrosis visualized.  Abdominal aorta:  No aneurysm visualized.  Other findings:  None.  IMPRESSION: Numerous small gallstones within the gallbladder. Associated gallbladder wall thickening, pericholecystic fluid and positive sonographic Murphy sign compatible with acute cholecystitis. Echogenic foci within the anterior thickened gallbladder wall. Cannot completely exclude gallbladder wall gas.  These results were called by telephone at the time of interpretation on 01/21/2014 at 1:01 AM to [facility] , who verbally acknowledged these results.   Electronically Signed   By: Rolm Baptise M.D.   On: 01/21/2014 01:05    Problem List: Patient Active Problem List   Diagnosis Date Noted  . Acute cholecystitis with chronic cholecystitis 01/21/2014    Assessment &  Plan: Acute on chronic cholecystits Laparoscopic cholecystectomy    Matt B. Hassell Done, MD, [facility] [phone] beeper [phone]  01/21/2014 7:00 AM

## 2014-01-21 NOTE — Anesthesia Preprocedure Evaluation (Signed)
Anesthesia Evaluation  Patient identified by MRN, date of birth, ID band Patient awake    Reviewed: Allergy & Precautions, H&P , NPO status , Patient's Chart, lab work & pertinent test results  Airway Mallampati: II TM Distance: >3 FB Neck ROM: Full    Dental no notable dental hx.    Pulmonary neg pulmonary ROS,  breath sounds clear to auscultation  Pulmonary exam normal       Cardiovascular hypertension, negative cardio ROS  Rhythm:Regular Rate:Normal     Neuro/Psych negative neurological ROS  negative psych ROS   GI/Hepatic negative GI ROS, Neg liver ROS,   Endo/Other  negative endocrine ROS  Renal/GU negative Renal ROS  negative genitourinary   Musculoskeletal negative musculoskeletal ROS (+)   Abdominal   Peds negative pediatric ROS (+)  Hematology negative hematology ROS (+)   Anesthesia Other Findings   Reproductive/Obstetrics negative OB ROS                           Anesthesia Physical Anesthesia Plan  ASA: II  Anesthesia Plan: General   Post-op Pain Management:    Induction: Intravenous  Airway Management Planned: Oral ETT  Additional Equipment:   Intra-op Plan:   Post-operative Plan: Extubation in OR  Informed Consent: I have reviewed the patients History and Physical, chart, labs and discussed the procedure including the risks, benefits and alternatives for the proposed anesthesia with the patient or authorized representative who has indicated his/her understanding and acceptance.   Dental advisory given  Plan Discussed with: CRNA  Anesthesia Plan Comments: (Avoid tylenol. AST/ALT elevated)        Anesthesia Quick Evaluation

## 2014-01-21 NOTE — ED Notes (Signed)
Patient resting in position of comfort with eyes closed s/p admin of pain medication, see MAR RR WNL--even and unlabored with equal rise and fall of chest Patient in NAD Side rails up, call bell in reach  

## 2014-01-21 NOTE — ED Provider Notes (Signed)
Pt relates about 6 weeks of abdominal discomfort in his RUQ that got worse today. States he has been on a "isogenics" diet with diet shakes. Has had nausea today with self-induced vomiting without relief. He denies fever and chills tonight but did have some a few weeks ago.   Pt is alert and cooperative. Abdominal exam deferred.    Medical screening examination/treatment/procedure(s) were conducted as a shared visit with non-physician practitioner(s) and myself.  I personally evaluated the patient during the encounter.  EKG Interpretation   None        Christopher Porter, MD, Christopher Jensen   Christopher Norrie, MD 01/21/14 [phone]

## 2014-01-21 NOTE — Consult Note (Signed)
[facility] Consult Note  Referring Provider: No ref. provider found Primary Care Physician:  Vena Austria, MD Primary Gastroenterologist:  Dr.  Laurel Dimmer Complaint: Common bile duct stones at the time of cholecystectomy HPI: Christopher Jensen is an 63 y.o. white male  who is admitted with cholecystitis and probable common bile duct stones abdominal ultrasound and typical presentation of biliary colic last night. He underwent cholecystectomy today an intraoperative cholangiogram did show several distal common bile duct filling defects. The patient is alert oriented and ambulatory after surgery earlier today and is having mild epigastric pain and tenderness. He has no other major medical problems other than listed below.  Past Medical History  Diagnosis Date  . Hypertension   . Anxiety     Past Surgical History  Procedure Laterality Date  . Nasal sinus surgery      Medications Prior to Admission  Medication Sig Dispense Refill  . ciprofloxacin (CIPRO) 500 MG tablet Take 500 mg by mouth 2 (two) times daily.      . DULoxetine (CYMBALTA) 30 MG capsule Take 30 mg by mouth daily.      . famotidine (PEPCID AC) 10 MG chewable tablet Chew 10 mg by mouth once.      . valsartan-hydrochlorothiazide (DIOVAN-HCT) 320-12.5 MG per tablet Take 1 tablet by mouth every morning.        Allergies:  Allergies  Allergen Reactions  . Other     Contrast dye, reaction was flushing and labored breathing.  . Shellfish Allergy Other (See Comments)    GI upset    Family History  Problem Relation Age of Onset  . Breast cancer Mother   . Lung cancer Mother     Social History:  reports that he has never smoked. He has never used smokeless tobacco. He reports that he drinks about 0.6 ounces of alcohol per week. He reports that he does not use illicit drugs.  Review of Systems: negative except for some less intense biliary colic type attacks in 2 or 3 weeks preceding his acute  presentation.   Blood pressure 133/83, pulse 88, temperature 98.1 F (36.7 C), temperature source Oral, resp. rate 18, height 5' 6.5" (1.689 m), weight 79.379 kg (175 lb), SpO2 94.00%. Head: Normocephalic, without obvious abnormality, atraumatic Neck: no adenopathy, no carotid bruit, no JVD, supple, symmetrical, trachea midline and thyroid not enlarged, symmetric, no tenderness/mass/nodules Resp: clear to auscultation bilaterally Cardio: regular rate and rhythm, S1, S2 normal, no murmur, click, rub or gallop GI: Abdomen soft moderately distended deep palpation not done due to surgery earlier today. Extremities: extremities normal, atraumatic, no cyanosis or edema  Results for orders placed during the hospital encounter of 01/20/14 (from the past 48 hour(s))  CBC WITH DIFFERENTIAL     Status: Abnormal   Collection Time    01/20/14 10:52 PM      Result Value Ref Range   WBC 9.1  4.0 - 10.5 K/uL   RBC 4.36  4.22 - 5.81 MIL/uL   Hemoglobin 13.0  13.0 - 17.0 g/dL   HCT 37.1 (*) 39.0 - 52.0 %   MCV 85.1  78.0 - 100.0 fL   MCH 29.8  26.0 - 34.0 pg   MCHC 35.0  30.0 - 36.0 g/dL   RDW 13.2  11.5 - 15.5 %   Platelets 274  150 - 400 K/uL   Neutrophils Relative % 76  43 - 77 %   Neutro Abs 6.9  1.7 - 7.7 K/uL   Lymphocytes Relative  11 (*) 12 - 46 %   Lymphs Abs 1.0  0.7 - 4.0 K/uL   Monocytes Relative 11  3 - 12 %   Monocytes Absolute 1.0  0.1 - 1.0 K/uL   Eosinophils Relative 2  0 - 5 %   Eosinophils Absolute 0.2  0.0 - 0.7 K/uL   Basophils Relative 0  0 - 1 %   Basophils Absolute 0.0  0.0 - 0.1 K/uL  COMPREHENSIVE METABOLIC PANEL     Status: Abnormal   Collection Time    01/20/14 10:52 PM      Result Value Ref Range   Sodium 139  137 - 147 mEq/L   Potassium 3.3 (*) 3.7 - 5.3 mEq/L   Chloride 99  96 - 112 mEq/L   CO2 22  19 - 32 mEq/L   Glucose, Bld 163 (*) 70 - 99 mg/dL   BUN 21  6 - 23 mg/dL   Creatinine, Ser 1.26  0.50 - 1.35 mg/dL   Calcium 10.4  8.4 - 10.5 mg/dL   Total  Protein 6.8  6.0 - 8.3 g/dL   Albumin 3.3 (*) 3.5 - 5.2 g/dL   AST 115 (*) 0 - 37 U/L   ALT 119 (*) 0 - 53 U/L   Alkaline Phosphatase 222 (*) 39 - 117 U/L   Total Bilirubin 1.8 (*) 0.3 - 1.2 mg/dL   GFR calc non Af Amer 59 (*) >90 mL/min   GFR calc Af Amer 68 (*) >90 mL/min   Comment: (NOTE)     The eGFR has been calculated using the CKD EPI equation.     This calculation has not been validated in all clinical situations.     eGFR's persistently <90 mL/min signify possible Chronic Kidney     Disease.  LIPASE, BLOOD     Status: None   Collection Time    01/20/14 10:52 PM      Result Value Ref Range   Lipase 29  11 - 59 U/L  TROPONIN I     Status: None   Collection Time    01/20/14 10:52 PM      Result Value Ref Range   Troponin I <0.30  <0.30 ng/mL   Comment:            Due to the release kinetics of cTnI,     a negative result within the first hours     of the onset of symptoms does not rule out     myocardial infarction with certainty.     If myocardial infarction is still suspected,     repeat the test at appropriate intervals.  URINALYSIS, ROUTINE W REFLEX MICROSCOPIC     Status: Abnormal   Collection Time    01/20/14 11:16 PM      Result Value Ref Range   Color, Urine YELLOW  YELLOW   APPearance CLOUDY (*) CLEAR   Specific Gravity, Urine 1.016  1.005 - 1.030   pH 8.0  5.0 - 8.0   Glucose, UA NEGATIVE  NEGATIVE mg/dL   Hgb urine dipstick NEGATIVE  NEGATIVE   Bilirubin Urine NEGATIVE  NEGATIVE   Ketones, ur NEGATIVE  NEGATIVE mg/dL   Protein, ur NEGATIVE  NEGATIVE mg/dL   Urobilinogen, UA 1.0  0.0 - 1.0 mg/dL   Nitrite NEGATIVE  NEGATIVE   Leukocytes, UA NEGATIVE  NEGATIVE   Comment: MICROSCOPIC NOT DONE ON URINES WITH NEGATIVE PROTEIN, BLOOD, LEUKOCYTES, NITRITE, OR GLUCOSE <1000 mg/dL.  CBC  Status: Abnormal   Collection Time    01/21/14  1:50 PM      Result Value Ref Range   WBC 7.8  4.0 - 10.5 K/uL   RBC 4.11 (*) 4.22 - 5.81 MIL/uL   Hemoglobin 12.3 (*)  13.0 - 17.0 g/dL   HCT 35.2 (*) 39.0 - 52.0 %   MCV 85.6  78.0 - 100.0 fL   MCH 29.9  26.0 - 34.0 pg   MCHC 34.9  30.0 - 36.0 g/dL   RDW 13.5  11.5 - 15.5 %   Platelets 271  150 - 400 K/uL  CREATININE, SERUM     Status: Abnormal   Collection Time    01/21/14  1:50 PM      Result Value Ref Range   Creatinine, Ser 1.17  0.50 - 1.35 mg/dL   GFR calc non Af Amer 65 (*) >90 mL/min   GFR calc Af Amer 75 (*) >90 mL/min   Comment: (NOTE)     The eGFR has been calculated using the CKD EPI equation.     This calculation has not been validated in all clinical situations.     eGFR's persistently <90 mL/min signify possible Chronic Kidney     Disease.   Dg Cholangiogram Operative  01/21/2014   CLINICAL DATA:  Cholecystectomy for acute cholecystitis.  EXAM: INTRAOPERATIVE CHOLANGIOGRAM  TECHNIQUE: Cholangiographic images from the C-arm fluoroscopic device were submitted for interpretation post-operatively. Please see the procedural report for the amount of contrast and the fluoroscopy time utilized.  COMPARISON:  Abdomen ultrasound dated 01/20/2014.  FINDINGS: Multiple small filling defects in the distal common duct. There is also a rounded filling defect in the common hepatic duct. No ductal dilatation. Normal appearing cystic duct remnant. Free flow of contrast into the duodenum. Cholecystectomy clips are noted.  IMPRESSION: Multiple retained ductal stones.   Electronically Signed   By: Enrique Sack M.D.   On: 01/21/2014 12:14   US Abdomen Complete  01/21/2014   CLINICAL DATA:  Right upper quadrant pain, vomiting.  Elevated LFTs.  EXAM: ULTRASOUND ABDOMEN COMPLETE  COMPARISON:  CT UROGRAM dated 01/01/2013  FINDINGS: Gallbladder:  Multiple gallstones and sludge noted within the gallbladder. Gallbladder wall thickening, measuring up to 7 mm. The patient was significantly tender over the gallbladder during the study. Small amount of pericholecystic fluid. There are echogenic foci within the thickened anterior  gallbladder wall. While this could reflect adherent cholesterol crystals, I cannot completely exclude gas within the gallbladder wall anteriorly.  Common bile duct:  Diameter: Borderline in caliber at 7 mm.  No visible ductal stones.  Liver:  Hyperechoic area in the right hepatic lobe of measuring up to 8 mm compatible with small hemangioma.  IVC:  No abnormality visualized.  Pancreas:  Not visualized due to overlying bowel gas.  Spleen:  Size and appearance within normal limits.  Right Kidney:  Length: 10.3 cm. Echogenicity within normal limits. No mass or hydronephrosis visualized.  Left Kidney:  Length: 11.6 cm. Echogenicity within normal limits. No mass or hydronephrosis visualized.  Abdominal aorta:  No aneurysm visualized.  Other findings:  None.  IMPRESSION: Numerous small gallstones within the gallbladder. Associated gallbladder wall thickening, pericholecystic fluid and positive sonographic Murphy sign compatible with acute cholecystitis. Echogenic foci within the anterior thickened gallbladder wall. Cannot completely exclude gallbladder wall gas.  These results were called by telephone at the time of interpretation on 01/21/2014 at 1:01 AM to [facility] , who verbally acknowledged these results.  Electronically Signed   By: Rolm Baptise M.D.   On: 01/21/2014 01:05    Assessment: 1. Cholecystitis status post cholecystectomy 2. Common bile duct stone seen on intraoperative cholangiogram. 3.  Plan:  Will proceed with endoscopic retrograde cholangiopancreatography with attempted stone removal tomorrow. Risks benefits and alternatives were explained to the patient and he wishes to proceed. Tina Gruner C 01/21/2014, 3:55 PM

## 2014-01-21 NOTE — ED Notes (Signed)
MD at bedside. 

## 2014-01-21 NOTE — Transfer of Care (Signed)
Immediate Anesthesia Transfer of Care Note  Patient: Christopher Jensen  Procedure(s) Performed: Procedure(s) (LRB): LAPAROSCOPIC CHOLECYSTECTOMY WITH INTRAOPERATIVE CHOLANGIOGRAM (N/A)  Patient Location: PACU  Anesthesia Type: General  Level of Consciousness: sedated, patient cooperative and responds to stimulation  Airway & Oxygen Therapy: Patient Spontanous Breathing and Patient connected to face mask oxgen  Post-op Assessment: Report given to PACU RN and Post -op Vital signs reviewed and stable  Post vital signs: Reviewed and stable  Complications: No apparent anesthesia complications

## 2014-01-21 NOTE — ED Notes (Signed)
Patient to OR

## 2014-01-21 NOTE — Op Note (Addendum)
01/20/2014 - 01/21/2014  12:06 PM  PATIENT:  Christopher Jensen, 63 y.o., male, MRN: [id number]  PREOP DIAGNOSIS:  gallstones  POSTOP DIAGNOSIS:   Chronic/acute cholecystitis, cholelithiasis, probable multiple common bile duct stones  PROCEDURE:   Procedure(s): LAPAROSCOPIC CHOLECYSTECTOMY WITH INTRAOPERATIVE CHOLANGIOGRAM  SURGEON:   Alphonsa Overall, M.D.  Terrence DupontHassell Done, M.D.   ANESTHESIA:   general  Anesthesiologist: Montez Hageman, MD CRNA: Maxwell Caul, CRNA  General  ASA: 2  EBL:  minimal  ml  BLOOD ADMINISTERED: none  DRAINS: none   LOCAL MEDICATIONS USED:   30 cc 1/2 % Marcaine  SPECIMEN:   Gall bladder  COUNTS CORRECT:  YES  INDICATIONS FOR PROCEDURE:  Christopher Jensen is a 63 y.o. (DOB: 1951-03-08) white  male whose primary care physician is Vena Austria, MD and comes for cholecystectomy.   The indications and risks of the gall bladder surgery were explained to the patient.  The risks include, but are not limited to, infection, bleeding, common bile duct injury and open surgery.  SURGERY:  The patient was taken to room #[facility] at [facility].  The abdomen was prepped with chloroprep.  The patient was given 2 gm Ancef at the beginning of the operation.   A time out was held and the surgical checklist run.   An infraumbilical incision was made into the abdominal cavity.  A 12 mm Hasson trocar was inserted into the abdominal cavity through the infraumbilical incision and secured with a 0 Vicryl suture.  Three additional trocars were inserted: a 10 mm trocar in the sub-xiphoid location, a 5 mm trocar in the right mid subcostal area, and a 5 mm trocar in the right lateral subcostal area.   The abdomen was explored and the liver, stomach, and bowel that could be seen were unremarkable.   The gall bladder was inflamed with omental adhesions over its entire surface.  I first took the omental adhesions down.  Then I grasped the gall bladder and rotated it  cephalad.  Disssection was carried down to the gall bladder/cystic duct junction and the cystic duct isolated.  There was a lot of chronic inflammation around the cystic duct and triangle of Calot.  A clip was placed on the gall bladder side of the cystic duct.   An intra-operative cholangiogram was shot.   The intra-operative cholangiogram was shot using a cut off Taut catheter placed through a 14 gauge angiocath in the RUQ.  The Taut catheter was inserted in the cut cystic duct and secured with an endoclip.  A cholangiogram was shot with 10 cc of 1/2 strength Omnipaque.  Using fluoroscopy, the cholangiogram showed the flow of contrast into the common bile duct, up the hepatic radicals, and into the duodenum.  However, there appear to be multiple common bile duct stones on the IOC.     The Taut catheter was removed.  The cystic duct was endoclipped.  But because of the inflammation around the duct, I placed two 0 PDS endoloops.  The cystic artery was identified and clipped.  The gall bladder was bluntly and sharpley dissected from the gall bladder bed.   After the gall bladder was removed from the liver, the gall bladder bed and Triangle of Calot were inspected.  The gall bladder was fused to the liver secondary to chronic inflammation.  There was no bleeding or bile leak.  The gall bladder was placed in a endocatch bag and delivered through the umbilicus.  The abdomen was irrigated  with 2,500 cc saline.   The trocars were then removed.  I infiltrated 30cc of 1/4% Marcaine into the incisions.  The umbilical port closed with a 0 Vicryl suture and the skin closed with 5-0 monocryl.  The skin was painted with Dermabond.  The patient's sponge and needle count were correct.  The patient was transported to the RR in good condition.  Alphonsa Overall, MD, [facility] Surgery Pager: [phone] Office phone:  [phone]

## 2014-01-21 NOTE — ED Notes (Signed)
Patient awake and states that pain is returning PA made aware and PRN orders placed Patient medicated, see MAR Patient denies further needs at this time Side rails up, call bell in reach  Awaiting surgical consult

## 2014-01-21 NOTE — Progress Notes (Addendum)
Patient admitted with acute cholecystitis.  Seen by Dr. Hassell Done.  Dr. Lenard Simmer is his PCP.  He has had symptoms on and off for 6 weeks. But became more acutely ill last PM.   I discussed with the patient the indications and risks of gall bladder surgery.  The primary risks of gall bladder surgery include, but are not limited to, bleeding, infection, common bile duct injury, and open surgery.  There is also the risk that the patient may have continued symptoms after surgery.  However, the likelihood of improvement in symptoms and return to the patient's normal status is good. We discussed the typical post-operative recovery course. I tried to answer the patient's questions.   His wife is at the beach in [place] (it has snowed here - so for now she is stuck).  He phone number is [phone].  He has a daughter in town, but she has a sick child, so she's home.  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   For surgery later this AM.  Alphonsa Overall, MD, [facility] Surgery Pager: [phone] Office phone:  [phone]

## 2014-01-21 NOTE — ED Provider Notes (Signed)
See prior note   Janice Norrie, MD 01/21/14 [phone]

## 2014-01-21 NOTE — Preoperative (Signed)
Beta Blockers   Reason not to administer Beta Blockers:Not Applicable 

## 2014-01-22 ENCOUNTER — Encounter (HOSPITAL_COMMUNITY): Admission: EM | Disposition: A | Discharge status: Home / Self Care | Payer: Self-pay | Source: Home / Self Care

## 2014-01-22 ENCOUNTER — Inpatient Hospital Stay (HOSPITAL_COMMUNITY): Payer: BC Managed Care – PPO

## 2014-01-22 ENCOUNTER — Observation Stay (HOSPITAL_COMMUNITY): Payer: BC Managed Care – PPO | Admitting: Certified Registered"

## 2014-01-22 ENCOUNTER — Encounter (HOSPITAL_COMMUNITY): Payer: BC Managed Care – PPO | Admitting: Certified Registered"

## 2014-01-22 ENCOUNTER — Encounter (HOSPITAL_COMMUNITY): Payer: Self-pay | Admitting: *Deleted

## 2014-01-22 HISTORY — PX: ERCP: SHX5425

## 2014-01-22 LAB — COMPREHENSIVE METABOLIC PANEL
ALK PHOS: 249 U/L — AB (ref 39–117)
ALT: 246 U/L — AB (ref 0–53)
AST: 205 U/L — AB (ref 0–37)
Albumin: 3.2 g/dL — ABNORMAL LOW (ref 3.5–5.2)
BUN: 12 mg/dL (ref 6–23)
CO2: 25 mEq/L (ref 19–32)
Calcium: 9.7 mg/dL (ref 8.4–10.5)
Chloride: 104 mEq/L (ref 96–112)
Creatinine, Ser: 1.15 mg/dL (ref 0.50–1.35)
GFR calc Af Amer: 76 mL/min — ABNORMAL LOW (ref >90)
GFR calc non Af Amer: 66 mL/min — ABNORMAL LOW (ref >90)
Glucose, Bld: 143 mg/dL — ABNORMAL HIGH (ref 70–99)
POTASSIUM: 4.3 meq/L (ref 3.7–5.3)
SODIUM: 139 meq/L (ref 137–147)
TOTAL PROTEIN: 6.3 g/dL (ref 6.0–8.3)
Total Bilirubin: 1.7 mg/dL — ABNORMAL HIGH (ref 0.3–1.2)

## 2014-01-22 LAB — CBC
HCT: 34.2 % — ABNORMAL LOW (ref 39.0–52.0)
Hemoglobin: 11.5 g/dL — ABNORMAL LOW (ref 13.0–17.0)
MCH: 29.3 pg (ref 26.0–34.0)
MCHC: 33.6 g/dL (ref 30.0–36.0)
MCV: 87 fL (ref 78.0–100.0)
PLATELETS: 280 10*3/uL (ref 150–400)
RBC: 3.93 MIL/uL — AB (ref 4.22–5.81)
RDW: 13.9 % (ref 11.5–15.5)
WBC: 11.5 10*3/uL — ABNORMAL HIGH (ref 4.0–10.5)

## 2014-01-22 SURGERY — ERCP, WITH INTERVENTION IF INDICATED
Anesthesia: General

## 2014-01-22 SURGERY — Surgical Case

## 2014-01-22 MED ORDER — MIDAZOLAM HCL 5 MG/5ML IJ SOLN
INTRAMUSCULAR | Status: DC | PRN
  Administered 2014-01-22: 2 mg via INTRAVENOUS

## 2014-01-22 MED ORDER — PROPOFOL 10 MG/ML IV BOLUS
INTRAVENOUS | Status: AC
  Filled 2014-01-22: qty 20

## 2014-01-22 MED ORDER — LACTATED RINGERS IV SOLN
INTRAVENOUS | Status: DC | PRN
  Administered 2014-01-22: 13:00:00 via INTRAVENOUS

## 2014-01-22 MED ORDER — GLUCAGON HCL (RDNA) 1 MG IJ SOLR
INTRAMUSCULAR | Status: AC
  Filled 2014-01-22: qty 1

## 2014-01-22 MED ORDER — FENTANYL CITRATE 0.05 MG/ML IJ SOLN
INTRAMUSCULAR | Status: AC
  Filled 2014-01-22: qty 2

## 2014-01-22 MED ORDER — FENTANYL CITRATE 0.05 MG/ML IJ SOLN: 25.0000 ug | INTRAMUSCULAR | Status: DC | PRN

## 2014-01-22 MED ORDER — SUCCINYLCHOLINE CHLORIDE 20 MG/ML IJ SOLN
INTRAMUSCULAR | Status: DC | PRN
  Administered 2014-01-22: 100 mg via INTRAVENOUS

## 2014-01-22 MED ORDER — ONDANSETRON HCL 4 MG/2ML IJ SOLN
INTRAMUSCULAR | Status: AC
  Filled 2014-01-22: qty 2

## 2014-01-22 MED ORDER — SODIUM CHLORIDE 0.9 % IV SOLN
INTRAVENOUS | Status: DC | PRN
  Administered 2014-01-22: 16:00:00

## 2014-01-22 MED ORDER — MIDAZOLAM HCL 2 MG/2ML IJ SOLN
INTRAMUSCULAR | Status: AC
  Filled 2014-01-22: qty 2

## 2014-01-22 MED ORDER — ONDANSETRON HCL 4 MG/2ML IJ SOLN
INTRAMUSCULAR | Status: DC | PRN
  Administered 2014-01-22: 4 mg via INTRAVENOUS

## 2014-01-22 MED ORDER — SODIUM CHLORIDE 0.9 % IV SOLN
INTRAVENOUS | Status: DC
Start: 2014-01-22 — End: 2014-01-22
  Administered 2014-01-22: 11:00:00 via INTRAVENOUS

## 2014-01-22 MED ORDER — EPHEDRINE SULFATE 50 MG/ML IJ SOLN
INTRAMUSCULAR | Status: DC | PRN
  Administered 2014-01-22 (×2): 10 mg via INTRAVENOUS

## 2014-01-22 MED ORDER — PROPOFOL 10 MG/ML IV BOLUS
INTRAVENOUS | Status: DC | PRN
  Administered 2014-01-22: 200 mg via INTRAVENOUS

## 2014-01-22 MED ORDER — FENTANYL CITRATE 0.05 MG/ML IJ SOLN
INTRAMUSCULAR | Status: DC | PRN
  Administered 2014-01-22: 100 ug via INTRAVENOUS
  Administered 2014-01-22 (×2): 50 ug via INTRAVENOUS

## 2014-01-22 NOTE — Preoperative (Signed)
Beta Blockers   Reason not to administer Beta Blockers:Not Applicable 

## 2014-01-22 NOTE — Progress Notes (Signed)
UR completed. Patient changed to inpatient- requiring IV antibiotics, IVF @100cc /hr, and needs ERCP

## 2014-01-22 NOTE — Progress Notes (Signed)
ERCP done, single black pigmented stone removed after sphincterotomy. Patient tolerated well.

## 2014-01-22 NOTE — Op Note (Signed)
[facility] [place] [place], [id number]   ERCP PROCEDURE REPORT  PATIENT: Christopher Jensen, Christopher Jensen.  MR# :[id number] BIRTHDATE: [birth year]  GENDER: Male ENDOSCOPIST: Teena Irani, MD REFERRED BY: PROCEDURE DATE:  01/22/2014 PROCEDURE: ASA CLASS: INDICATIONS:common bile duct stone seen at time of cholecystectomy MEDICATIONS: general anesthesia TOPICAL ANESTHETIC:  DESCRIPTION OF PROCEDURE:   After the risks benefits and alternatives of the procedure were thoroughly explained, informed consent was obtained.  The Pentax side-viewing       endoscope was introduced through the mouth  and advanced to the papilla of Vater      .it had a normal appearance and initially free cannulation of the pancreatic duct was achieved.after multiple unsuccessful attempts at guidewire cannulation  eventually this was successful at entering the common bile duct. A cholangiogram was obtained which showed a single small stone. A large sphincterotomy was performed and a 12-15 mm balloon catheter inflated high into the common hepatic duct and dragged through the ampulla with one black stone seen to be retrieved.  The scope was then completely withdrawn from the patient and the procedure terminated.     COMPLICATIONS: none  ENDOSCOPIC IMPRESSION: single common bile duct stone removed with a balloon after sphincterotomy. RECOMMENDATIONS: advance diet, recheck liver function tests in the morning. observe overnight for complications.    _______________________________ Lorrin MaisTeena Irani, MD 01/22/2014 3:57 PM   CC:

## 2014-01-22 NOTE — Progress Notes (Signed)
Patient resting prone extremities pressure point pads in place, axillary rolls in place.

## 2014-01-22 NOTE — Anesthesia Postprocedure Evaluation (Signed)
  Anesthesia Post-op Note  Patient: Christopher Jensen  Procedure(s) Performed: Procedure(s) (LRB): ENDOSCOPIC RETROGRADE CHOLANGIOPANCREATOGRAPHY (ERCP) (N/A)  Patient Location: PACU  Anesthesia Type: General  Level of Consciousness: awake and alert   Airway and Oxygen Therapy: Patient Spontanous Breathing  Post-op Pain: mild  Post-op Assessment: Post-op Vital signs reviewed, Patient's Cardiovascular Status Stable, Respiratory Function Stable, Patent Airway and No signs of Nausea or vomiting  Last Vitals:  Filed Vitals:   01/22/14 1620  BP: 132/82  Pulse:   Temp:   Resp: 21    Post-op Vital Signs: stable   Complications: No apparent anesthesia complications

## 2014-01-22 NOTE — Care Management Note (Signed)
    Page 1 of 1   01/22/2014     9:10:38 AM   CARE MANAGEMENT NOTE 01/22/2014  Patient:  Christopher Jensen, Christopher Jensen   Account Number:  [id number]  Date Initiated:  01/22/2014  Documentation initiated by:  Sunday Spillers  Subjective/Objective Assessment:   63 yo male admitted s/p lap chole. PTA lived at home with spouse.     Action/Plan:   Home when stable   Anticipated DC Date:  01/22/2014   Anticipated DC Plan:  [place]  CM consult      Choice offered to / List presented to:             Status of service:  Completed, signed off Medicare Important Message given?   (If response is "NO", the following Medicare IM given date fields will be blank) Date Medicare IM given:   Date Additional Medicare IM given:    Discharge Disposition:  HOME/SELF CARE  Per UR Regulation:  Reviewed for med. necessity/level of care/duration of stay  If discussed at [place] of Stay Meetings, dates discussed:    Comments:

## 2014-01-22 NOTE — Transfer of Care (Signed)
Immediate Anesthesia Transfer of Care Note  Patient: Christopher Jensen  Procedure(s) Performed: Procedure(s) (LRB): ENDOSCOPIC RETROGRADE CHOLANGIOPANCREATOGRAPHY (ERCP) (N/A)  Patient Location: PACU  Anesthesia Type: General  Level of Consciousness: sedated, patient cooperative and responds to stimulation  Airway & Oxygen Therapy: Patient Spontanous Breathing and Patient connected to face mask oxgen  Post-op Assessment: Report given to PACU RN and Post -op Vital signs reviewed and stable  Post vital signs: Reviewed and stable  Complications: No apparent anesthesia complications

## 2014-01-22 NOTE — Progress Notes (Signed)
1 Day Post-Op  Subjective: Sore in RUQ.  Objective: Vital signs in last 24 hours: Temp:  [97.5 F (36.4 C)-98.7 F (37.1 C)] 97.5 F (36.4 C) (02/18 0458) Pulse Rate:  [66-91] 66 (02/18 0458) Resp:  [11-19] 16 (02/18 0458) BP: (118-140)/(70-83) 118/75 mmHg (02/18 0458) SpO2:  [93 %-100 %] 93 % (02/18 0458) Weight:  [175 lb (79.379 kg)] 175 lb (79.379 kg) (02/17 1430) Last BM Date: 01/20/14  Intake/Output from previous day: 02/17 0701 - 02/18 0700 In: 2353.3 [I.V.:2253.3; IV Piggyback:100] Out: 2400 [Urine:2400] Intake/Output this shift:    PE: General- In NAD Abdomen-soft, incisions clean and intact  Lab Results:   Recent Labs  01/21/14 1350 01/22/14 0451  WBC 7.8 11.5*  HGB 12.3* 11.5*  HCT 35.2* 34.2*  PLT 271 280   BMET  Recent Labs  01/20/14 2252 01/21/14 1350 01/22/14 0451  NA 139  --  139  K 3.3*  --  4.3  CL 99  --  104  CO2 22  --  25  GLUCOSE 163*  --  143*  BUN 21  --  12  CREATININE 1.26 1.17 1.15  CALCIUM 10.4  --  9.7   PT/INR No results found for this basename: LABPROT, INR,  in the last 72 hours Comprehensive Metabolic Panel:    Component Value Date/Time   NA 139 01/22/2014 0451   NA 139 01/20/2014 2252   K 4.3 01/22/2014 0451   K 3.3* 01/20/2014 2252   CL 104 01/22/2014 0451   CL 99 01/20/2014 2252   CO2 25 01/22/2014 0451   CO2 22 01/20/2014 2252   BUN 12 01/22/2014 0451   BUN 21 01/20/2014 2252   CREATININE 1.15 01/22/2014 0451   CREATININE 1.17 01/21/2014 1350   GLUCOSE 143* 01/22/2014 0451   GLUCOSE 163* 01/20/2014 2252   CALCIUM 9.7 01/22/2014 0451   CALCIUM 10.4 01/20/2014 2252   AST 205* 01/22/2014 0451   AST 115* 01/20/2014 2252   ALT 246* 01/22/2014 0451   ALT 119* 01/20/2014 2252   ALKPHOS 249* 01/22/2014 0451   ALKPHOS 222* 01/20/2014 2252   BILITOT 1.7* 01/22/2014 0451   BILITOT 1.8* 01/20/2014 2252   PROT 6.3 01/22/2014 0451   PROT 6.8 01/20/2014 2252   ALBUMIN 3.2* 01/22/2014 0451   ALBUMIN 3.3* 01/20/2014 2252      Studies/Results: Dg Cholangiogram Operative  01/21/2014   CLINICAL DATA:  Cholecystectomy for acute cholecystitis.  EXAM: INTRAOPERATIVE CHOLANGIOGRAM  TECHNIQUE: Cholangiographic images from the C-arm fluoroscopic device were submitted for interpretation post-operatively. Please see the procedural report for the amount of contrast and the fluoroscopy time utilized.  COMPARISON:  Abdomen ultrasound dated 01/20/2014.  FINDINGS: Multiple small filling defects in the distal common duct. There is also a rounded filling defect in the common hepatic duct. No ductal dilatation. Normal appearing cystic duct remnant. Free flow of contrast into the duodenum. Cholecystectomy clips are noted.  IMPRESSION: Multiple retained ductal stones.   Electronically Signed   By: Enrique Sack M.D.   On: 01/21/2014 12:14   US Abdomen Complete  01/21/2014   CLINICAL DATA:  Right upper quadrant pain, vomiting.  Elevated LFTs.  EXAM: ULTRASOUND ABDOMEN COMPLETE  COMPARISON:  CT UROGRAM dated 01/01/2013  FINDINGS: Gallbladder:  Multiple gallstones and sludge noted within the gallbladder. Gallbladder wall thickening, measuring up to 7 mm. The patient was significantly tender over the gallbladder during the study. Small amount of pericholecystic fluid. There are echogenic foci within the thickened anterior gallbladder wall. While  this could reflect adherent cholesterol crystals, I cannot completely exclude gas within the gallbladder wall anteriorly.  Common bile duct:  Diameter: Borderline in caliber at 7 mm.  No visible ductal stones.  Liver:  Hyperechoic area in the right hepatic lobe of measuring up to 8 mm compatible with small hemangioma.  IVC:  No abnormality visualized.  Pancreas:  Not visualized due to overlying bowel gas.  Spleen:  Size and appearance within normal limits.  Right Kidney:  Length: 10.3 cm. Echogenicity within normal limits. No mass or hydronephrosis visualized.  Left Kidney:  Length: 11.6 cm. Echogenicity within  normal limits. No mass or hydronephrosis visualized.  Abdominal aorta:  No aneurysm visualized.  Other findings:  None.  IMPRESSION: Numerous small gallstones within the gallbladder. Associated gallbladder wall thickening, pericholecystic fluid and positive sonographic Murphy sign compatible with acute cholecystitis. Echogenic foci within the anterior thickened gallbladder wall. Cannot completely exclude gallbladder wall gas.  These results were called by telephone at the time of interpretation on 01/21/2014 at 1:01 AM to [facility] , who verbally acknowledged these results.   Electronically Signed   By: Rolm Baptise M.D.   On: 01/21/2014 01:05    Anti-infectives: Anti-infectives   Start     Dose/Rate Route Frequency Ordered Stop   01/21/14 1800  ceFAZolin (ANCEF) IVPB 1 g/50 mL premix     1 g 100 mL/hr over 30 Minutes Intravenous Every 8 hours 01/21/14 1325     01/21/14 0130  piperacillin-tazobactam (ZOSYN) IVPB 3.375 g     3.375 g 100 mL/hr over 30 Minutes Intravenous  Once 01/21/14 0122 01/21/14 0206      Assessment Active Problems:   Acute cholecystitis with chronic cholecystitis-s/p lap chole with IOC 01/21/14   Choledocholithiasis    LOS: 2 days   Plan: ERCP today.   Odis Hollingshead 01/22/2014

## 2014-01-22 NOTE — Anesthesia Preprocedure Evaluation (Signed)
Anesthesia Evaluation  Patient identified by MRN, date of birth, ID band Patient awake    Reviewed: Allergy & Precautions, H&P , NPO status , Patient's Chart, lab work & pertinent test results  Airway Mallampati: II TM Distance: >3 FB Neck ROM: Full    Dental no notable dental hx.    Pulmonary neg pulmonary ROS,  breath sounds clear to auscultation  Pulmonary exam normal       Cardiovascular Exercise Tolerance: Good hypertension, Pt. on medications negative cardio ROS  Rhythm:Regular Rate:Normal     Neuro/Psych negative neurological ROS  negative psych ROS   GI/Hepatic negative GI ROS, Neg liver ROS,   Endo/Other  negative endocrine ROS  Renal/GU negative Renal ROS  negative genitourinary   Musculoskeletal negative musculoskeletal ROS (+)   Abdominal   Peds negative pediatric ROS (+)  Hematology negative hematology ROS (+)   Anesthesia Other Findings   Reproductive/Obstetrics negative OB ROS                           Anesthesia Physical Anesthesia Plan  ASA: II  Anesthesia Plan: General   Post-op Pain Management:    Induction: Intravenous  Airway Management Planned: Oral ETT  Additional Equipment:   Intra-op Plan:   Post-operative Plan: Extubation in OR  Informed Consent: I have reviewed the patients History and Physical, chart, labs and discussed the procedure including the risks, benefits and alternatives for the proposed anesthesia with the patient or authorized representative who has indicated his/her understanding and acceptance.   Dental advisory given  Plan Discussed with: CRNA  Anesthesia Plan Comments:         Anesthesia Quick Evaluation

## 2014-01-23 ENCOUNTER — Encounter (HOSPITAL_COMMUNITY): Payer: Self-pay | Admitting: Gastroenterology

## 2014-01-23 LAB — COMPREHENSIVE METABOLIC PANEL
ALBUMIN: 3 g/dL — AB (ref 3.5–5.2)
ALK PHOS: 251 U/L — AB (ref 39–117)
ALT: 194 U/L — ABNORMAL HIGH (ref 0–53)
AST: 132 U/L — ABNORMAL HIGH (ref 0–37)
BUN: 13 mg/dL (ref 6–23)
CALCIUM: 9.4 mg/dL (ref 8.4–10.5)
CO2: 27 mEq/L (ref 19–32)
Chloride: 107 mEq/L (ref 96–112)
Creatinine, Ser: 1.17 mg/dL (ref 0.50–1.35)
GFR calc Af Amer: 75 mL/min — ABNORMAL LOW (ref >90)
GFR calc non Af Amer: 65 mL/min — ABNORMAL LOW (ref >90)
Glucose, Bld: 112 mg/dL — ABNORMAL HIGH (ref 70–99)
POTASSIUM: 3.7 meq/L (ref 3.7–5.3)
SODIUM: 144 meq/L (ref 137–147)
TOTAL PROTEIN: 5.9 g/dL — AB (ref 6.0–8.3)
Total Bilirubin: 1 mg/dL (ref 0.3–1.2)

## 2014-01-23 LAB — PSA: PSA: 2.21 ng/mL (ref <=4.00)

## 2014-01-23 MED ORDER — IBUPROFEN 200 MG PO TABS: ORAL_TABLET | ORAL | Status: DC

## 2014-01-23 MED ORDER — ACETAMINOPHEN 325 MG PO TABS: 650.0000 mg | ORAL_TABLET | Freq: Four times a day (QID) | ORAL | Status: DC | PRN

## 2014-01-23 MED ORDER — MAGNESIUM HYDROXIDE 400 MG/5ML PO SUSP
30.0000 mL | Freq: Every day | ORAL | Status: DC | PRN
  Administered 2014-01-23: 30 mL via ORAL
  Filled 2014-01-23: qty 30

## 2014-01-23 MED ORDER — HYDROCODONE-ACETAMINOPHEN 5-325 MG PO TABS: 1.0000 | ORAL_TABLET | ORAL | Status: DC | PRN

## 2014-01-23 NOTE — Anesthesia Postprocedure Evaluation (Signed)
  Anesthesia Post-op Note  Patient: Christopher Jensen  Procedure(s) Performed: Procedure(s) (LRB): LAPAROSCOPIC CHOLECYSTECTOMY WITH INTRAOPERATIVE CHOLANGIOGRAM (N/A)  Patient Location: PACU  Anesthesia Type: General  Level of Consciousness: awake and alert   Airway and Oxygen Therapy: Patient Spontanous Breathing  Post-op Pain: mild  Post-op Assessment: Post-op Vital signs reviewed, Patient's Cardiovascular Status Stable, Respiratory Function Stable, Patent Airway and No signs of Nausea or vomiting  Last Vitals:  Filed Vitals:   01/23/14 0604  BP: 111/63  Pulse: 79  Temp: 37.1 C  Resp: 18    Post-op Vital Signs: stable   Complications: No apparent anesthesia complications

## 2014-01-23 NOTE — Discharge Instructions (Signed)
Laparoscopic Cholecystectomy °Laparoscopic cholecystectomy is surgery to remove the gallbladder. The gallbladder is located in the upper right part of the abdomen, behind the liver. It is a storage sac for bile produced in the liver. Bile aids in the digestion and absorption of fats. Cholecystectomy is often done for inflammation of the gallbladder (cholecystitis). This condition is usually caused by a buildup of gallstones (cholelithiasis) in your gallbladder. Gallstones can block the flow of bile, resulting in inflammation and pain. In severe cases, emergency surgery may be required. When emergency surgery is not required, you will have time to prepare for the procedure. °Laparoscopic surgery is an alternative to open surgery. Laparoscopic surgery has a shorter recovery time. Your common bile duct may also need to be examined during the procedure. If stones are found in the common bile duct, they may be removed. °LET YOUR HEALTH CARE PROVIDER KNOW ABOUT: °· Any allergies you have. °· All medicines you are taking, including vitamins, herbs, eye drops, creams, and over-the-counter medicines. °· Previous problems you or members of your family have had with the use of anesthetics. °· Any blood disorders you have. °· Previous surgeries you have had. °· Medical conditions you have. °RISKS AND COMPLICATIONS °Generally, this is a safe procedure. However, as with any procedure, complications can occur. Possible complications include: °· Infection. °· Damage to the common bile duct, nerves, arteries, veins, or other internal organs such as the stomach, liver, or intestines. °· Bleeding. °· A stone may remain in the common bile duct. °· A bile leak from the cyst duct that is clipped when your gallbladder is removed. °· The need to convert to open surgery, which requires a larger incision in the abdomen. This may be necessary if your surgeon thinks it is not safe to continue with a laparoscopic procedure. °BEFORE THE  PROCEDURE °· Ask your health care provider about changing or stopping any regular medicines. You will need to stop taking aspirin or blood thinners at least 5 days prior to surgery. °· Do not eat or drink anything after midnight the night before surgery. °· Let your health care provider know if you develop a cold or other infectious problem before surgery. °PROCEDURE  °· You will be given medicine to make you sleep through the procedure (general anesthetic). A breathing tube will be placed in your mouth. °· When you are asleep, your surgeon will make several small cuts (incisions) in your abdomen. °· A thin, lighted tube with a tiny camera on the end (laparoscope) is inserted through one of the small incisions. The camera on the laparoscope sends a picture to a TV screen in the operating room. This gives the surgeon a good view inside your abdomen. °· A gas will be pumped into your abdomen. This expands your abdomen so that the surgeon has more room to perform the surgery. °· Other tools needed for the procedure are inserted through the other incisions. The gallbladder is removed through one of the incisions. °· After the removal of your gallbladder, the incisions will be closed with stitches, staples, or skin glue. °AFTER THE PROCEDURE °· You will be taken to a recovery area where your progress will be checked often. °· You may be allowed to go home the same day if your pain is controlled and you can tolerate liquids. °Document Released: 11/21/2005 Document Revised: 09/11/2013 Document Reviewed: 07/03/2013 °ExitCare® Patient Information ©2014 [employer]. ° °CCS ______[facility] °LAPAROSCOPIC SURGERY: POST OP INSTRUCTIONS °Always review your discharge instruction sheet   given to you by the facility where your surgery was performed. °IF YOU HAVE DISABILITY OR FAMILY LEAVE FORMS, YOU MUST BRING THEM TO THE OFFICE FOR PROCESSING.   °DO NOT GIVE THEM TO YOUR DOCTOR. ° °1. A prescription for pain  medication may be given to you upon discharge.  Take your pain medication as prescribed, if needed.  If narcotic pain medicine is not needed, then you may take acetaminophen (Tylenol) or ibuprofen (Advil) as needed. °2. Take your usually prescribed medications unless otherwise directed. °3. If you need a refill on your pain medication, please contact your pharmacy.  They will contact our office to request authorization. Prescriptions will not be filled after 5pm or on week-ends. °4. You should follow a light diet the first few days after arrival home, such as soup and crackers, etc.  Be sure to include lots of fluids daily. °5. Most patients will experience some swelling and bruising in the area of the incisions.  Ice packs will help.  Swelling and bruising can take several days to resolve.  °6. It is common to experience some constipation if taking pain medication after surgery.  Increasing fluid intake and taking a stool softener (such as Colace) will usually help or prevent this problem from occurring.  A mild laxative (Milk of Magnesia or Miralax) should be taken according to package instructions if there are no bowel movements after 48 hours. °7. Unless discharge instructions indicate otherwise, you may remove your bandages 24-48 hours after surgery, and you may shower at that time.  You may have steri-strips (small skin tapes) in place directly over the incision.  These strips should be left on the skin for 7-10 days.  If your surgeon used skin glue on the incision, you may shower in 24 hours.  The glue will flake off over the next 2-3 weeks.  Any sutures or staples will be removed at the office during your follow-up visit. °8. ACTIVITIES:  You may resume regular (light) daily activities beginning the next day--such as daily self-care, walking, climbing stairs--gradually increasing activities as tolerated.  You may have sexual intercourse when it is comfortable.  Refrain from any heavy lifting or straining  until approved by your doctor. °a. You may drive when you are no longer taking prescription pain medication, you can comfortably wear a seatbelt, and you can safely maneuver your car and apply brakes. °b. RETURN TO WORK:  __________________________________________________________ °9. You should see your doctor in the office for a follow-up appointment approximately 2-3 weeks after your surgery.  Make sure that you call for this appointment within a day or two after you arrive home to insure a convenient appointment time. °10. OTHER INSTRUCTIONS: __________________________________________________________________________________________________________________________ __________________________________________________________________________________________________________________________ °WHEN TO CALL YOUR DOCTOR: °1. Fever over 101.0 °2. Inability to urinate °3. Continued bleeding from incision. °4. Increased pain, redness, or drainage from the incision. °5. Increasing abdominal pain ° °The clinic staff is available to answer your questions during regular business hours.  Please don’t hesitate to call and ask to speak to one of the nurses for clinical concerns.  If you have a medical emergency, go to the nearest emergency room or call 911.  A surgeon from [facility] is always on call at the hospital. °[address], [address], [place], [place]  [address] ? [address] [address], [place], [place]   [address] °[phone] ? [phone] ? FAX [phone] °Web site: [email or web address] ° °

## 2014-01-23 NOTE — Progress Notes (Signed)
1 Day Post-Op  Subjective: Feels better and tolerated a regular breakfast.,  Objective: Vital signs in last 24 hours: Temp:  [97.8 F (36.6 C)-98.8 F (37.1 C)] 98.8 F (37.1 C) (02/19 0604) Pulse Rate:  [64-79] 79 (02/19 0604) Resp:  [10-21] 18 (02/19 0604) BP: (109-165)/(63-88) 111/63 mmHg (02/19 0604) SpO2:  [88 %-99 %] 92 % (02/19 0604) Last BM Date: 01/20/14 120 PO, afebrile, VSS LFT's improving ERCP with Sphincterotomy:  ERCP shows potential subtle filling defect in the distal common bile duct. A balloon sweep maneuver was performed for stone extraction.    Intake/Output from previous day: 02/18 0701 - 02/19 0700 In: 3683.3 [P.O.:120; I.V.:3413.3; IV Piggyback:150] Out: 1625 [Urine:1625] Intake/Output this shift: Total I/O In: -  Out: 800 [Urine:800]  General appearance: alert, cooperative and no distress GI: soft sore, + BS, taking diet.  Lab Results:   Recent Labs  01/21/14 1350 01/22/14 0451  WBC 7.8 11.5*  HGB 12.3* 11.5*  HCT 35.2* 34.2*  PLT 271 280    BMET  Recent Labs  01/22/14 0451 01/23/14 0007  NA 139 144  K 4.3 3.7  CL 104 107  CO2 25 27  GLUCOSE 143* 112*  BUN 12 13  CREATININE 1.15 1.17  CALCIUM 9.7 9.4   PT/INR No results found for this basename: LABPROT, INR,  in the last 72 hours   Recent Labs Lab 01/20/14 2252 01/22/14 0451 01/23/14 0007  AST 115* 205* 132*  ALT 119* 246* 194*  ALKPHOS 222* 249* 251*  BILITOT 1.8* 1.7* 1.0  PROT 6.8 6.3 5.9*  ALBUMIN 3.3* 3.2* 3.0*     Lipase     Component Value Date/Time   LIPASE 29 01/20/2014 2252     Studies/Results: Dg Ercp With Sphincterotomy  01/22/2014   CLINICAL DATA:  Choledocholithiasis and status post cholecystectomy.  EXAM: ERCP  TECHNIQUE: Multiple spot images obtained with the fluoroscopic device and submitted for interpretation post-procedure.  COMPARISON:  DG CHOLANGIOGRAM OPERATIVE dated 01/21/2014; US ABDOMEN COMPLETE dated 01/20/2014  FINDINGS: Submitted  imaging during ERCP shows some motion artifact. There may be a subtle filling defect in the distal common bile duct. A balloon sweep maneuver was performed.  IMPRESSION: ERCP shows potential subtle filling defect in the distal common bile duct. A balloon sweep maneuver was performed for stone extraction.  These images were submitted for radiologic interpretation only. Please see the procedural report for the amount of contrast and the fluoroscopy time utilized.   Electronically Signed   By: Aletta Edouard M.D.   On: 01/22/2014 16:17    Medications: .  ceFAZolin (ANCEF) IV  1 g Intravenous Q8H  . DULoxetine  30 mg Oral Daily  . heparin subcutaneous  5,000 Units Subcutaneous 3 times per day  . pantoprazole (PROTONIX) IV  40 mg Intravenous QHS    Assessment/Plan Acute cholecystitis with chronic cholecystitis-s/p lap chole with IOC 01/21/14  Choledocholithiasis ERCP with common bile duct stone removal with balloon and sphincterotomy. PSA 2.21  Plan:  Home today.    LOS: 3 days    JENNINGS,WILLARD 01/23/2014  Agree with above.  Alphonsa Overall, MD, [facility] Pager: [phone] Office phone:  [phone]

## 2014-01-23 NOTE — Discharge Summary (Addendum)
Physician Discharge Summary  Patient ID: Christopher Jensen MRN: [id number] DOB/AGE: 1951-02-28 63 y.o.  Admit date: 01/20/2014 Discharge date: 01/23/2014 PCP:  Vena Austria, MD  Admission Diagnoses:  Acute on chronic cholecystits Elevated LFT's  Discharge Diagnoses:  Chronic/acute cholecystitis, cholelithiasis, probable multiple common bile duct stones  Elevated LFT's Hypertension  Active Problems:   Acute cholecystitis with chronic cholecystitis   Cholecystitis  PROCEDURES:  1.  LAPAROSCOPIC CHOLECYSTECTOMY WITH INTRAOPERATIVE CHOLANGIOGRAM, 01/21/2014, Shann Medal, MD  2.  ERCP; Single common bile duct stone removed with a balloon after sphincterotomy.  01/22/2014, Missy Sabins, MD  Hospital Course: Christopher Jensen is an 63 y.o. male followed by Dr. Delfino Lovett and who had a Physical Exam within the last 2 weeks (elevated PSA) presents with a recurrent right upper quadrant pain and an ultrasound showing cholecystitis. Informed consent was obtained in the ED regarding cholecystectomy with potential complications not limited to CBD injury, bowel injuries, or bile leaks.  He was admitted by Dr. Hassell Done and seen that AM by DR. Arletha Marschke.  He was taken to the OR by Dr. Lucia Gaskins and did well.  IOC showed a retained stone and Dr. Amedeo Plenty was contacted.  He underwent ERCP the following day by DR. Amedeo Plenty.  He has done well LFT's are improving and he is tolerating a regular diet.Marland Kitchen  He is ready for discharge.  PSA ordered here was 2.21 which we will share with Dr. Alyson Ingles.  He is going home on a regular diet as tolerated.  I told him to go slow with the fatty foods for now. He will follow up with our office in a couple weeks.  Condition on d/c:  Improved. CBC    Component Value Date/Time   WBC 11.5* 01/22/2014 0451   RBC 3.93* 01/22/2014 0451   HGB 11.5* 01/22/2014 0451   HCT 34.2* 01/22/2014 0451   PLT 280 01/22/2014 0451   MCV 87.0 01/22/2014 0451   MCH 29.3 01/22/2014 0451   MCHC  33.6 01/22/2014 0451   RDW 13.9 01/22/2014 0451   LYMPHSABS 1.0 01/20/2014 2252   MONOABS 1.0 01/20/2014 2252   EOSABS 0.2 01/20/2014 2252   BASOSABS 0.0 01/20/2014 2252     Recent Labs Lab 01/20/14 2252 01/22/14 0451 01/23/14 0007  AST 115* 205* 132*  ALT 119* 246* 194*  ALKPHOS 222* 249* 251*  BILITOT 1.8* 1.7* 1.0  PROT 6.8 6.3 5.9*  ALBUMIN 3.3* 3.2* 3.0*   PSA: 2.21  Disposition:  Home     Medication List    STOP taking these medications       ciprofloxacin 500 MG tablet  Commonly known as:  CIPRO      TAKE these medications       acetaminophen 325 MG tablet  Commonly known as:  TYLENOL  Take 2 tablets (650 mg total) by mouth every 6 (six) hours as needed for mild pain, moderate pain, fever or headache.     DULoxetine 30 MG capsule  Commonly known as:  CYMBALTA  Take 30 mg by mouth daily.     famotidine 10 MG chewable tablet  Commonly known as:  PEPCID AC  Chew 10 mg by mouth once.     HYDROcodone-acetaminophen 5-325 MG per tablet  Commonly known as:  NORCO/VICODIN  Take 1-2 tablets by mouth every 4 (four) hours as needed for moderate pain.     ibuprofen 200 MG tablet  Commonly known as:  ADVIL,MOTRIN  You can take 2-3 tablets every 6 hours.  valsartan-hydrochlorothiazide 320-12.5 MG per tablet  Commonly known as:  DIOVAN-HCT  Take 1 tablet by mouth every morning.           Follow-up Information   Follow up with [facility], MD. Schedule an appointment as soon as possible for a visit in 2 weeks. (Ask for an appointment in 2-3 weeks)    Specialty:  General Surgery   Contact information:   [place] [address] [phone]       Follow up with Vena Austria, MD. (Call for a follow up appointment and discuss your PSA/medical issues.)    Specialty:  Family Medicine   Contact information:   [place] [address] [phone]       Signed: Earnstine Regal 01/23/2014,  12:25 PM  Agree with above.  Alphonsa Overall, MD, [facility] Pager: [phone] Office phone:  [phone]

## 2014-02-27 ENCOUNTER — Encounter (INDEPENDENT_AMBULATORY_CARE_PROVIDER_SITE_OTHER): Payer: Self-pay | Admitting: Surgery

## 2014-02-27 ENCOUNTER — Ambulatory Visit (INDEPENDENT_AMBULATORY_CARE_PROVIDER_SITE_OTHER): Payer: BC Managed Care – PPO | Admitting: Surgery

## 2014-02-27 VITALS — BP 130/80 | HR 80 | Temp 97.6°F | Resp 14 | Ht 67.0 in | Wt 181.4 lb

## 2014-02-27 DIAGNOSIS — K812 Acute cholecystitis with chronic cholecystitis: Secondary | ICD-10-CM

## 2014-02-27 NOTE — Progress Notes (Signed)
Chacra, MD,  [address]  [place], [place]    [place] Phone:  [phone] FAX:  [phone]   Re:   [name] DOB:   February 26, 1951 MRN:   [id number]  ASSESSMENT AND PLAN: 1.  Lap chole with IOC - 01/21/2014 - D. Lucia Gaskins  He has done well.  His return appt is PRN.   HISTORY OF PRESENT ILLNESS: Chief Complaint  Patient presents with  . Routine Post Op    New 1st po GB    Christopher Jensen is a 63 y.o. (DOB: Jan 12, 1951)  white  male who is a patient of Christopher ALEXANDER, MD and comes to me today for follow up of lap chole. He has done well. We talked about the findings.  Past Medical History  Diagnosis Date  . Hypertension   . 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  PHYSICAL EXAM: BP 130/80  Pulse 80  Temp(Src) 97.6 F (36.4 C)  Resp 14  Ht 5\' 7"  (1.702 m)  Wt 181 lb 6.4 oz (82.283 kg)  BMI 28.40 kg/m2  Abdomen:  Wounds look good.  DATA REVIEWED: I gave him a copy of his path report.   Alphonsa Overall, MD,  [facility], [address]  [place], [place]    [place] Phone:  [phone] FAX:  [phone]

## 2014-06-27 IMAGING — RF DG ERCP WO/W SPHINCTEROTOMY
6 series · 7 of 7 positions shown · non-contrast
Comparison: DG CHOLANGIOGRAM OPERATIVE dated 01/21/2014; US ABDOMEN
COMPLETE dated 01/20/2014

CLINICAL DATA: Choledocholithiasis and status post cholecystectomy.

EXAM:
ERCP
TECHNIQUE: Multiple spot images obtained with the fluoroscopic device and
submitted for interpretation post-procedure.

[Series 1: cont. · 1 of 1 slices shown (1 of 6)]
[im 1/1]
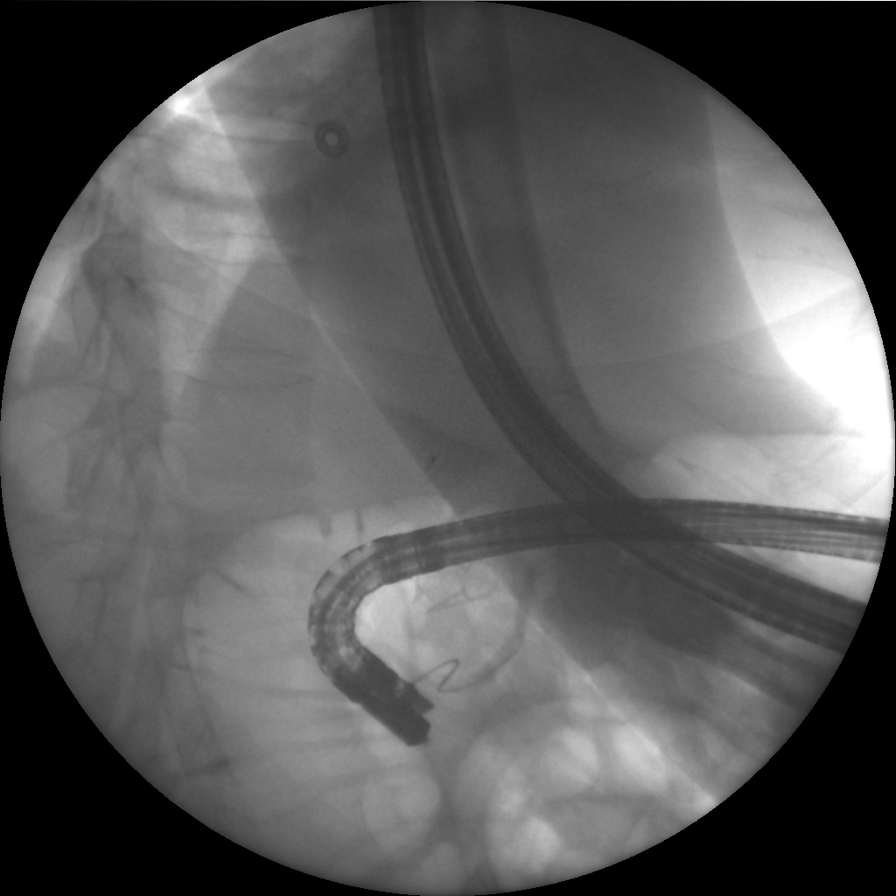

[Series 2: cont. · 1 of 1 slices shown (2 of 6)]
[im 1/1]
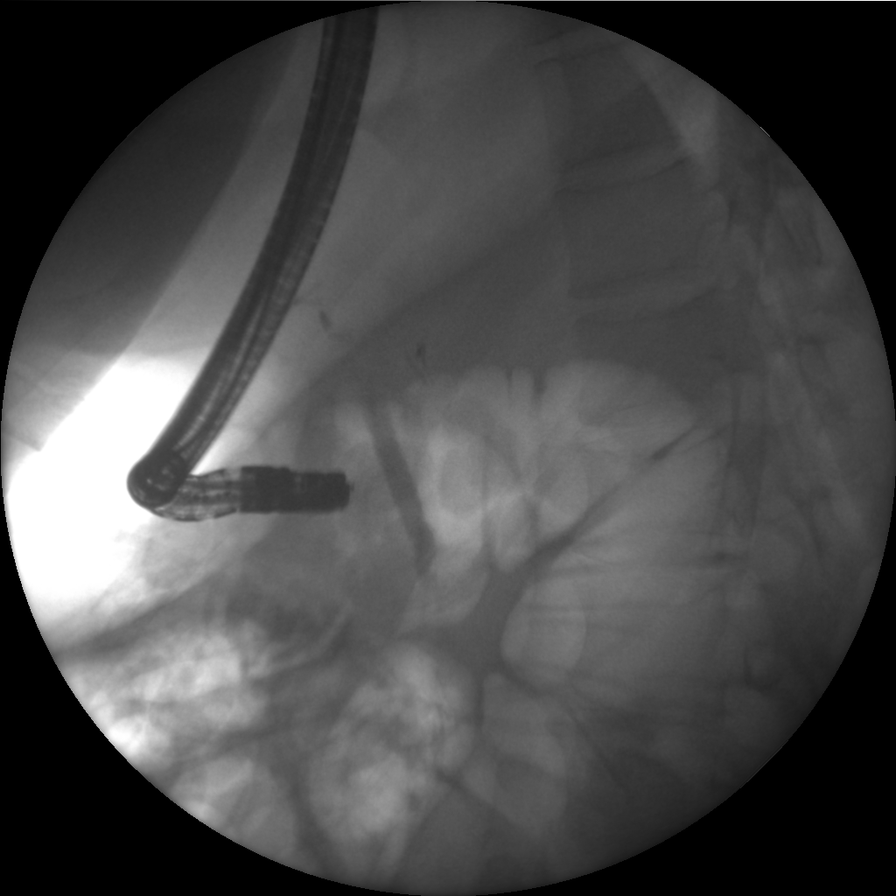

[Series 3: cont. · 1 of 1 slices shown (3 of 6)]
[im 1/1]
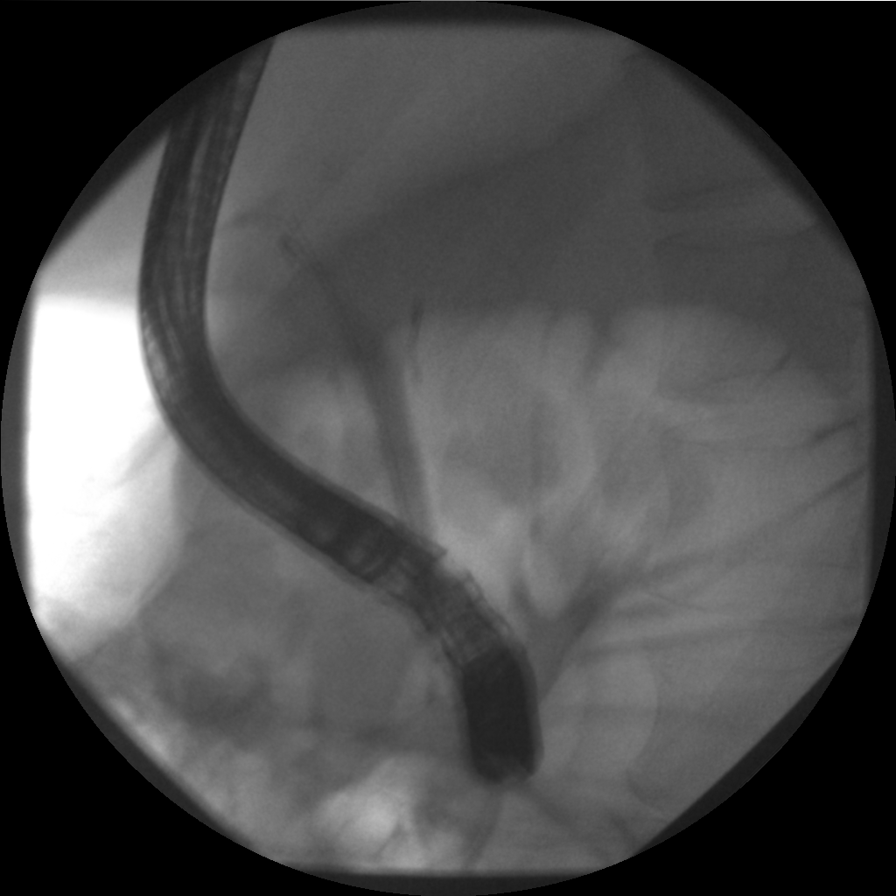

[Series 4: cont. · 1 of 1 slices shown (4 of 6)]
[im 1/1]
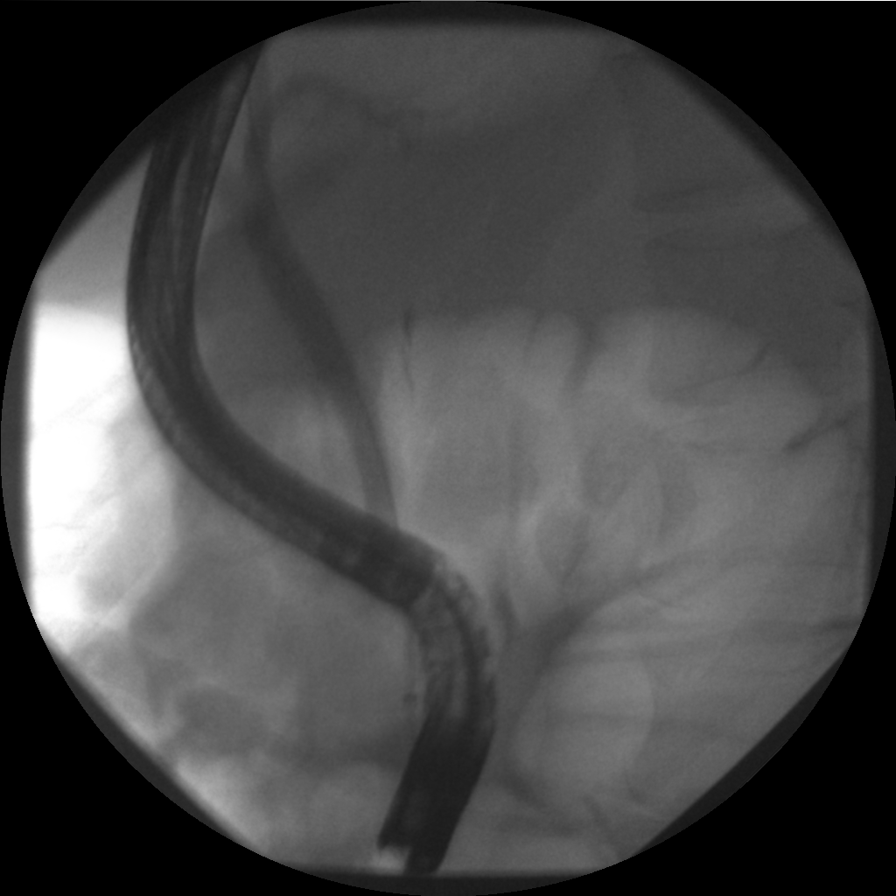

[Series 5: cont. · 1 of 1 slices shown (5 of 6)]
[im 1/1]
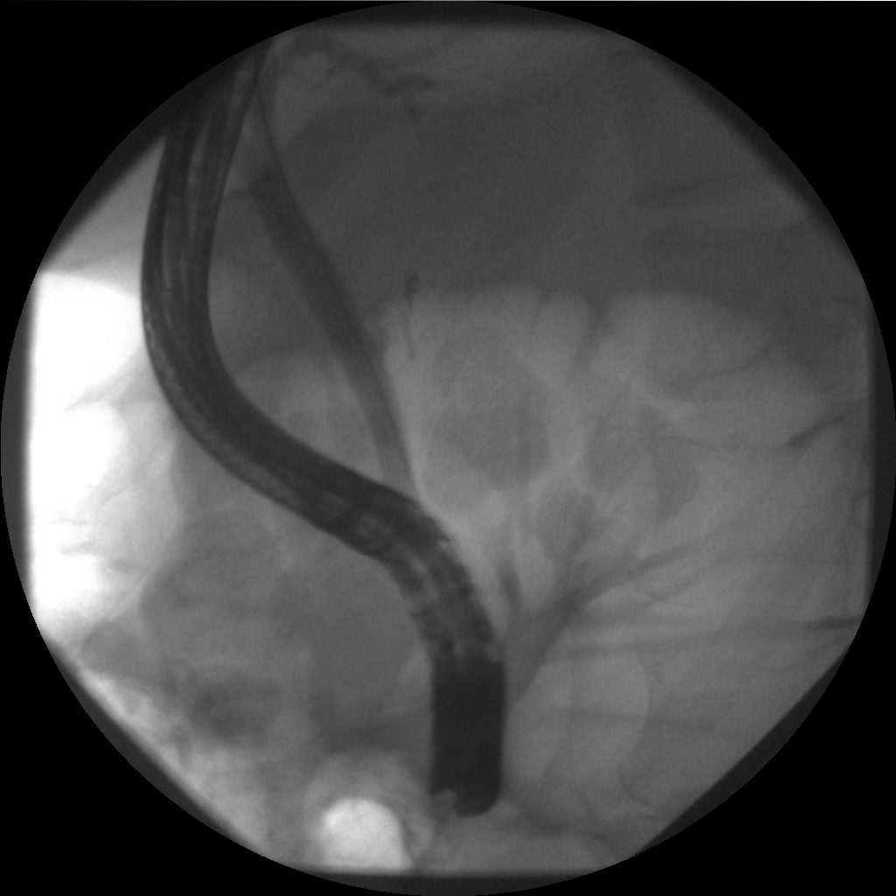

[Series 6: cont. · 2 of 2 slices shown (6 of 6)]
[im 1/2]
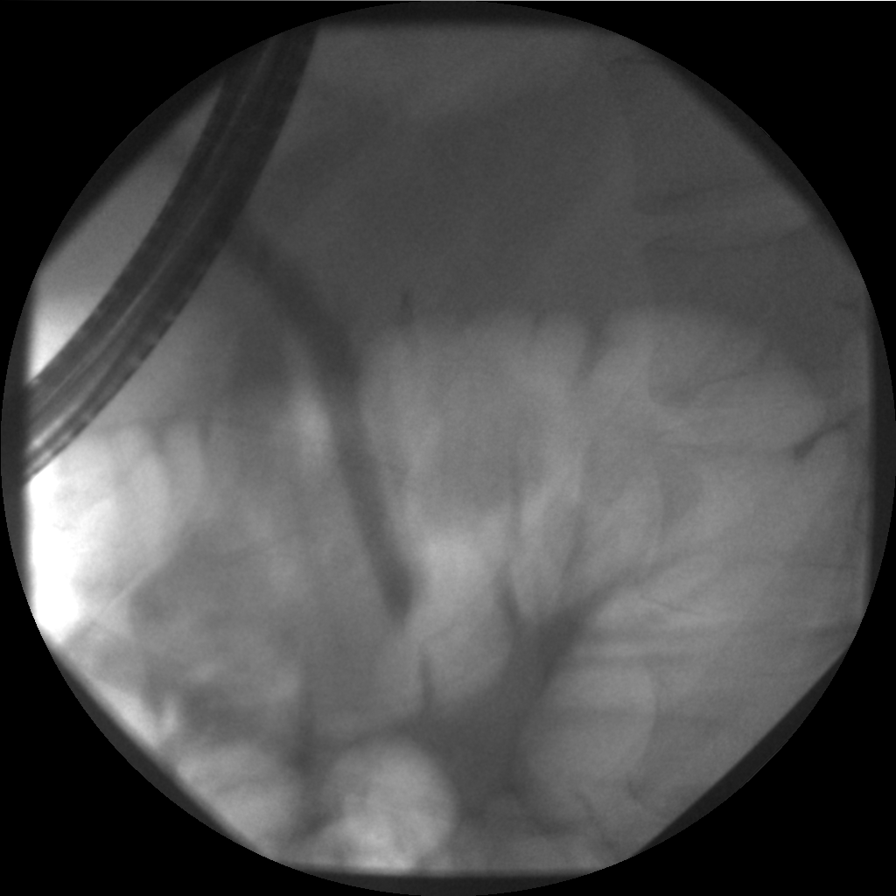
[im 2/2]
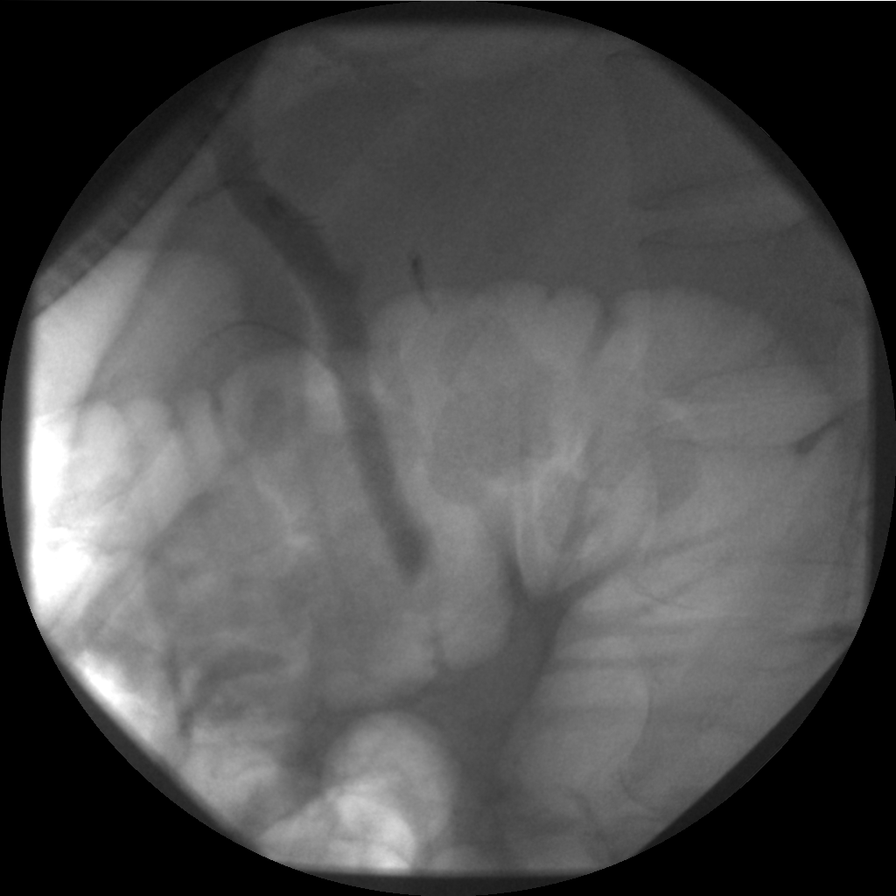

[7 of 7 positions shown; findings below may reference images not displayed]

FINDINGS: Submitted imaging during ERCP shows some motion artifact. There may
be a subtle filling defect in the distal common bile duct. A balloon
sweep maneuver was performed.
IMPRESSION: ERCP shows potential subtle filling defect in the distal common bile
duct. A balloon sweep maneuver was performed for stone extraction.

These images were submitted for radiologic interpretation only.
Please see the procedural report for the amount of contrast and the
fluoroscopy time utilized.

## 2021-01-19 DIAGNOSIS — L905 Scar conditions and fibrosis of skin: Secondary | ICD-10-CM | POA: Diagnosis not present

## 2021-01-19 DIAGNOSIS — B078 Other viral warts: Secondary | ICD-10-CM | POA: Diagnosis not present

## 2021-01-19 DIAGNOSIS — Z85828 Personal history of other malignant neoplasm of skin: Secondary | ICD-10-CM | POA: Diagnosis not present

## 2021-01-19 DIAGNOSIS — Z0000 Encounter for general adult medical examination without abnormal findings: Secondary | ICD-10-CM | POA: Diagnosis not present

## 2021-01-19 DIAGNOSIS — L570 Actinic keratosis: Secondary | ICD-10-CM | POA: Diagnosis not present

## 2021-01-19 DIAGNOSIS — N1831 Chronic kidney disease, stage 3a: Secondary | ICD-10-CM | POA: Diagnosis not present

## 2021-01-19 DIAGNOSIS — L821 Other seborrheic keratosis: Secondary | ICD-10-CM | POA: Diagnosis not present

## 2021-01-19 DIAGNOSIS — E7800 Pure hypercholesterolemia, unspecified: Secondary | ICD-10-CM | POA: Diagnosis not present

## 2021-01-19 DIAGNOSIS — D229 Melanocytic nevi, unspecified: Secondary | ICD-10-CM | POA: Diagnosis not present

## 2021-01-19 DIAGNOSIS — G4700 Insomnia, unspecified: Secondary | ICD-10-CM | POA: Diagnosis not present

## 2021-01-19 DIAGNOSIS — Z1389 Encounter for screening for other disorder: Secondary | ICD-10-CM | POA: Diagnosis not present

## 2021-01-19 DIAGNOSIS — L738 Other specified follicular disorders: Secondary | ICD-10-CM | POA: Diagnosis not present

## 2021-01-19 DIAGNOSIS — I129 Hypertensive chronic kidney disease with stage 1 through stage 4 chronic kidney disease, or unspecified chronic kidney disease: Secondary | ICD-10-CM | POA: Diagnosis not present

## 2021-01-19 DIAGNOSIS — L814 Other melanin hyperpigmentation: Secondary | ICD-10-CM | POA: Diagnosis not present

## 2021-01-20 DIAGNOSIS — Z20822 Contact with and (suspected) exposure to covid-19: Secondary | ICD-10-CM | POA: Diagnosis not present

## 2021-01-20 DIAGNOSIS — Z03818 Encounter for observation for suspected exposure to other biological agents ruled out: Secondary | ICD-10-CM | POA: Diagnosis not present

## 2021-02-16 DIAGNOSIS — L738 Other specified follicular disorders: Secondary | ICD-10-CM | POA: Diagnosis not present

## 2021-02-16 DIAGNOSIS — L570 Actinic keratosis: Secondary | ICD-10-CM | POA: Diagnosis not present

## 2021-02-17 DIAGNOSIS — N1831 Chronic kidney disease, stage 3a: Secondary | ICD-10-CM | POA: Diagnosis not present

## 2021-03-10 DIAGNOSIS — I129 Hypertensive chronic kidney disease with stage 1 through stage 4 chronic kidney disease, or unspecified chronic kidney disease: Secondary | ICD-10-CM | POA: Diagnosis not present

## 2021-03-10 DIAGNOSIS — N1831 Chronic kidney disease, stage 3a: Secondary | ICD-10-CM | POA: Diagnosis not present

## 2021-05-18 DIAGNOSIS — N1831 Chronic kidney disease, stage 3a: Secondary | ICD-10-CM | POA: Diagnosis not present

## 2021-07-20 DIAGNOSIS — G4700 Insomnia, unspecified: Secondary | ICD-10-CM | POA: Diagnosis not present

## 2021-07-20 DIAGNOSIS — N1831 Chronic kidney disease, stage 3a: Secondary | ICD-10-CM | POA: Diagnosis not present

## 2021-07-20 DIAGNOSIS — I129 Hypertensive chronic kidney disease with stage 1 through stage 4 chronic kidney disease, or unspecified chronic kidney disease: Secondary | ICD-10-CM | POA: Diagnosis not present

## 2021-07-20 DIAGNOSIS — E7800 Pure hypercholesterolemia, unspecified: Secondary | ICD-10-CM | POA: Diagnosis not present

## 2021-08-17 DIAGNOSIS — L02821 Furuncle of head [any part, except face]: Secondary | ICD-10-CM | POA: Diagnosis not present

## 2021-08-17 DIAGNOSIS — L820 Inflamed seborrheic keratosis: Secondary | ICD-10-CM | POA: Diagnosis not present

## 2021-08-17 DIAGNOSIS — L218 Other seborrheic dermatitis: Secondary | ICD-10-CM | POA: Diagnosis not present

## 2021-08-17 DIAGNOSIS — L538 Other specified erythematous conditions: Secondary | ICD-10-CM | POA: Diagnosis not present

## 2021-10-21 DIAGNOSIS — H6502 Acute serous otitis media, left ear: Secondary | ICD-10-CM | POA: Diagnosis not present

## 2021-10-21 DIAGNOSIS — H60502 Unspecified acute noninfective otitis externa, left ear: Secondary | ICD-10-CM | POA: Diagnosis not present

## 2021-10-21 DIAGNOSIS — H6982 Other specified disorders of Eustachian tube, left ear: Secondary | ICD-10-CM | POA: Diagnosis not present

## 2022-02-21 DIAGNOSIS — L538 Other specified erythematous conditions: Secondary | ICD-10-CM | POA: Diagnosis not present

## 2022-02-21 DIAGNOSIS — L814 Other melanin hyperpigmentation: Secondary | ICD-10-CM | POA: Diagnosis not present

## 2022-02-21 DIAGNOSIS — D225 Melanocytic nevi of trunk: Secondary | ICD-10-CM | POA: Diagnosis not present

## 2022-02-21 DIAGNOSIS — L570 Actinic keratosis: Secondary | ICD-10-CM | POA: Diagnosis not present

## 2022-02-21 DIAGNOSIS — L820 Inflamed seborrheic keratosis: Secondary | ICD-10-CM | POA: Diagnosis not present

## 2022-02-21 DIAGNOSIS — L821 Other seborrheic keratosis: Secondary | ICD-10-CM | POA: Diagnosis not present

## 2022-02-21 DIAGNOSIS — L853 Xerosis cutis: Secondary | ICD-10-CM | POA: Diagnosis not present

## 2022-02-21 DIAGNOSIS — L298 Other pruritus: Secondary | ICD-10-CM | POA: Diagnosis not present

## 2022-02-28 DIAGNOSIS — I129 Hypertensive chronic kidney disease with stage 1 through stage 4 chronic kidney disease, or unspecified chronic kidney disease: Secondary | ICD-10-CM | POA: Diagnosis not present

## 2022-02-28 DIAGNOSIS — Z1389 Encounter for screening for other disorder: Secondary | ICD-10-CM | POA: Diagnosis not present

## 2022-02-28 DIAGNOSIS — Z0000 Encounter for general adult medical examination without abnormal findings: Secondary | ICD-10-CM | POA: Diagnosis not present

## 2022-02-28 DIAGNOSIS — E7800 Pure hypercholesterolemia, unspecified: Secondary | ICD-10-CM | POA: Diagnosis not present

## 2022-02-28 DIAGNOSIS — N1831 Chronic kidney disease, stage 3a: Secondary | ICD-10-CM | POA: Diagnosis not present

## 2022-02-28 DIAGNOSIS — Z23 Encounter for immunization: Secondary | ICD-10-CM | POA: Diagnosis not present

## 2022-02-28 DIAGNOSIS — H60312 Diffuse otitis externa, left ear: Secondary | ICD-10-CM | POA: Diagnosis not present

## 2022-02-28 DIAGNOSIS — G4700 Insomnia, unspecified: Secondary | ICD-10-CM | POA: Diagnosis not present

## 2022-09-27 DIAGNOSIS — M62838 Other muscle spasm: Secondary | ICD-10-CM | POA: Diagnosis not present

## 2022-09-27 DIAGNOSIS — N1831 Chronic kidney disease, stage 3a: Secondary | ICD-10-CM | POA: Diagnosis not present

## 2022-09-27 DIAGNOSIS — G4700 Insomnia, unspecified: Secondary | ICD-10-CM | POA: Diagnosis not present

## 2022-09-27 DIAGNOSIS — I129 Hypertensive chronic kidney disease with stage 1 through stage 4 chronic kidney disease, or unspecified chronic kidney disease: Secondary | ICD-10-CM | POA: Diagnosis not present

## 2022-09-27 DIAGNOSIS — Z23 Encounter for immunization: Secondary | ICD-10-CM | POA: Diagnosis not present

## 2022-09-27 DIAGNOSIS — E7800 Pure hypercholesterolemia, unspecified: Secondary | ICD-10-CM | POA: Diagnosis not present

## 2024-05-14 DIAGNOSIS — F439 Reaction to severe stress, unspecified: Secondary | ICD-10-CM | POA: Diagnosis not present

## 2024-05-14 DIAGNOSIS — N401 Enlarged prostate with lower urinary tract symptoms: Secondary | ICD-10-CM | POA: Diagnosis not present

## 2024-05-14 DIAGNOSIS — I129 Hypertensive chronic kidney disease with stage 1 through stage 4 chronic kidney disease, or unspecified chronic kidney disease: Secondary | ICD-10-CM | POA: Diagnosis not present

## 2024-05-14 DIAGNOSIS — Z0000 Encounter for general adult medical examination without abnormal findings: Secondary | ICD-10-CM | POA: Diagnosis not present

## 2024-05-14 DIAGNOSIS — R7309 Other abnormal glucose: Secondary | ICD-10-CM | POA: Diagnosis not present

## 2024-05-14 DIAGNOSIS — E7800 Pure hypercholesterolemia, unspecified: Secondary | ICD-10-CM | POA: Diagnosis not present

## 2024-05-14 DIAGNOSIS — G4700 Insomnia, unspecified: Secondary | ICD-10-CM | POA: Diagnosis not present

## 2024-05-14 DIAGNOSIS — Z1331 Encounter for screening for depression: Secondary | ICD-10-CM | POA: Diagnosis not present

## 2024-05-14 DIAGNOSIS — N1831 Chronic kidney disease, stage 3a: Secondary | ICD-10-CM | POA: Diagnosis not present

## 2024-05-14 DIAGNOSIS — F411 Generalized anxiety disorder: Secondary | ICD-10-CM | POA: Diagnosis not present

## 2024-06-25 DIAGNOSIS — Z08 Encounter for follow-up examination after completed treatment for malignant neoplasm: Secondary | ICD-10-CM | POA: Diagnosis not present

## 2024-06-25 DIAGNOSIS — Z85828 Personal history of other malignant neoplasm of skin: Secondary | ICD-10-CM | POA: Diagnosis not present

## 2024-06-25 DIAGNOSIS — L821 Other seborrheic keratosis: Secondary | ICD-10-CM | POA: Diagnosis not present

## 2024-06-25 DIAGNOSIS — C44319 Basal cell carcinoma of skin of other parts of face: Secondary | ICD-10-CM | POA: Diagnosis not present

## 2024-06-25 DIAGNOSIS — D492 Neoplasm of unspecified behavior of bone, soft tissue, and skin: Secondary | ICD-10-CM | POA: Diagnosis not present

## 2024-06-25 DIAGNOSIS — L237 Allergic contact dermatitis due to plants, except food: Secondary | ICD-10-CM | POA: Diagnosis not present

## 2024-06-25 DIAGNOSIS — F319 Bipolar disorder, unspecified: Secondary | ICD-10-CM | POA: Diagnosis not present

## 2024-06-25 DIAGNOSIS — L814 Other melanin hyperpigmentation: Secondary | ICD-10-CM | POA: Diagnosis not present

## 2024-06-25 DIAGNOSIS — D225 Melanocytic nevi of trunk: Secondary | ICD-10-CM | POA: Diagnosis not present

## 2024-09-02 DIAGNOSIS — L905 Scar conditions and fibrosis of skin: Secondary | ICD-10-CM | POA: Diagnosis not present

## 2024-09-02 DIAGNOSIS — C44319 Basal cell carcinoma of skin of other parts of face: Secondary | ICD-10-CM | POA: Diagnosis not present

## 2024-10-24 DIAGNOSIS — H43813 Vitreous degeneration, bilateral: Secondary | ICD-10-CM | POA: Diagnosis not present

## 2024-10-24 DIAGNOSIS — H2513 Age-related nuclear cataract, bilateral: Secondary | ICD-10-CM | POA: Diagnosis not present

## 2024-10-24 DIAGNOSIS — H43393 Other vitreous opacities, bilateral: Secondary | ICD-10-CM | POA: Diagnosis not present

## 2024-10-24 DIAGNOSIS — H527 Unspecified disorder of refraction: Secondary | ICD-10-CM | POA: Diagnosis not present

## 2024-11-05 DIAGNOSIS — G4700 Insomnia, unspecified: Secondary | ICD-10-CM | POA: Diagnosis not present

## 2024-11-05 DIAGNOSIS — N1831 Chronic kidney disease, stage 3a: Secondary | ICD-10-CM | POA: Diagnosis not present

## 2024-11-05 DIAGNOSIS — E7800 Pure hypercholesterolemia, unspecified: Secondary | ICD-10-CM | POA: Diagnosis not present

## 2024-11-05 DIAGNOSIS — I129 Hypertensive chronic kidney disease with stage 1 through stage 4 chronic kidney disease, or unspecified chronic kidney disease: Secondary | ICD-10-CM | POA: Diagnosis not present

## 2024-11-05 DIAGNOSIS — Z23 Encounter for immunization: Secondary | ICD-10-CM | POA: Diagnosis not present

## 2024-11-05 DIAGNOSIS — F411 Generalized anxiety disorder: Secondary | ICD-10-CM | POA: Diagnosis not present

## 2024-11-05 DIAGNOSIS — N401 Enlarged prostate with lower urinary tract symptoms: Secondary | ICD-10-CM | POA: Diagnosis not present
